# Patient Record
Sex: Female | Born: 1965 | Race: White | Hispanic: No | Marital: Married | Smoking: Never smoker
Health system: Southern US, Community
[De-identification: ages and names within clinical notes are randomized; demographics above are authoritative.]

## PROBLEM LIST (undated history)

## (undated) DIAGNOSIS — F32A Depression, unspecified: Secondary | ICD-10-CM

## (undated) DIAGNOSIS — E669 Obesity, unspecified: Secondary | ICD-10-CM

## (undated) DIAGNOSIS — R232 Flushing: Secondary | ICD-10-CM

## (undated) DIAGNOSIS — I10 Essential (primary) hypertension: Secondary | ICD-10-CM

## (undated) DIAGNOSIS — F329 Major depressive disorder, single episode, unspecified: Secondary | ICD-10-CM

## (undated) DIAGNOSIS — C50912 Malignant neoplasm of unspecified site of left female breast: Secondary | ICD-10-CM

## (undated) DIAGNOSIS — K219 Gastro-esophageal reflux disease without esophagitis: Secondary | ICD-10-CM

## (undated) DIAGNOSIS — Z923 Personal history of irradiation: Secondary | ICD-10-CM

## (undated) HISTORY — DX: Essential (primary) hypertension: I10

## (undated) HISTORY — DX: Depression, unspecified: F32.A

## (undated) HISTORY — PX: ABCESS DRAINAGE: SHX399

## (undated) HISTORY — PX: ABDOMINAL HYSTERECTOMY: SHX81

## (undated) HISTORY — DX: Flushing: R23.2

## (undated) HISTORY — DX: Major depressive disorder, single episode, unspecified: F32.9

## (undated) HISTORY — DX: Personal history of irradiation: Z92.3

## (undated) HISTORY — PX: ESOPHAGOGASTRODUODENOSCOPY: SHX1529

## (undated) HISTORY — DX: Obesity, unspecified: E66.9

## (undated) HISTORY — DX: Gastro-esophageal reflux disease without esophagitis: K21.9

## (undated) NOTE — *Deleted (*Deleted)
   Patient Care Team: Marylen Ponto, MD as PCP - General (Family Medicine) Axel Filler, Larna Daughters, NP as Nurse Practitioner (Hematology and Oncology) Serena Croissant, MD as Consulting Physician (Hematology and Oncology) Antony Blackbird, MD as Consulting Physician (Radiation Oncology) Alesia Morin., MD as Referring Physician (Surgery)  DIAGNOSIS: No diagnosis found.  SUMMARY OF ONCOLOGIC HISTORY: Oncology History  Malignant neoplasm of upper-inner quadrant of left breast in female, estrogen receptor positive (HCC)  01/25/2017 Surgery   Left lumpectomy: IDC grade 1 and DCIS, 9 mmsize, margins negative, 0/2 lymph nodes negative, ER 95%, PR 100 %, HER-2 negative, Ki-67 10%T1b N0 stage IA   03/08/2017 - 04/20/2017 Radiation Therapy   Adjuvant radiation   05/2017 -  Anti-estrogen oral therapy   Tamoxifen daily     CHIEF COMPLIANT: Follow-up of left breast cancer  INTERVAL HISTORY: Tasha Thomas is a 83 y.o. with above-mentioned history of left breast cancer treated with lumpectomy,radiation, and whois currently on tamoxifen.She presents to the clinic today for follow-up.   ALLERGIES:  is allergic to erythromycin.  MEDICATIONS:  Current Outpatient Medications  Medication Sig Dispense Refill  . amLODipine (NORVASC) 5 MG tablet Take 5 mg by mouth daily.    . Biotin 5000 MCG CAPS Take by mouth daily.    . Calcium-Phosphorus-Vitamin D (CALCIUM GUMMIES PO) Take 1,000 mg by mouth daily.    . cetirizine (ZYRTEC) 10 MG tablet Take 10 mg by mouth.    . famotidine (PEPCID) 20 MG tablet Take 1 tablet (20 mg total) by mouth at bedtime as needed for heartburn or indigestion. 30 tablet 3  . hydrochlorothiazide (HYDRODIURIL) 25 MG tablet Take 1 tablet (25 mg total) by mouth daily.    . Multiple Vitamin (MULTIVITAMIN) tablet Take 1 tablet by mouth daily.    . pantoprazole (PROTONIX) 40 MG tablet Take 1 tablet (40 mg total) by mouth daily. 90 tablet 4  . tamoxifen (NOLVADEX) 20 MG tablet  Take 1 tablet (20 mg total) by mouth daily. 90 tablet 3   No current facility-administered medications for this visit.    PHYSICAL EXAMINATION: ECOG PERFORMANCE STATUS: {CHL ONC ECOG PS:[phone]}  There were no vitals filed for this visit. There were no vitals filed for this visit.  BREAST:*** No palpable masses or nodules in either right or left breasts. No palpable axillary supraclavicular or infraclavicular adenopathy no breast tenderness or nipple discharge. (exam performed in the presence of a chaperone)  LABORATORY DATA:  I have reviewed the data as listed No flowsheet data found.  No results found for: WBC, HGB, HCT, MCV, PLT, NEUTROABS  ASSESSMENT & PLAN:  No problem-specific Assessment & Plan notes found for this encounter.    No orders of the defined types were placed in this encounter.  The patient has a good understanding of the overall plan. she agrees with it. she will call with any problems that may develop before the next visit here.  Total time spent: *** mins including face to face time and time spent for planning, charting and coordination of care  Serena Croissant, MD 02/26/2020  I, Kirt Boys Dorshimer, am acting as scribe for Dr. Serena Croissant.  {insert scribe attestation}

---

## 2013-04-17 ENCOUNTER — Encounter: Payer: Self-pay | Admitting: Internal Medicine

## 2013-04-18 ENCOUNTER — Encounter: Payer: Self-pay | Admitting: Internal Medicine

## 2013-04-18 ENCOUNTER — Telehealth: Payer: Self-pay | Admitting: Internal Medicine

## 2013-04-18 ENCOUNTER — Ambulatory Visit (INDEPENDENT_AMBULATORY_CARE_PROVIDER_SITE_OTHER): Payer: Managed Care, Other (non HMO) | Admitting: Internal Medicine

## 2013-04-18 ENCOUNTER — Encounter (INDEPENDENT_AMBULATORY_CARE_PROVIDER_SITE_OTHER): Payer: Self-pay

## 2013-04-18 VITALS — BP 142/98 | HR 98 | Ht 64.0 in | Wt 222.0 lb

## 2013-04-18 DIAGNOSIS — R05 Cough: Secondary | ICD-10-CM

## 2013-04-18 DIAGNOSIS — R059 Cough, unspecified: Secondary | ICD-10-CM

## 2013-04-18 MED ORDER — GABAPENTIN 300 MG PO CAPS: ORAL_CAPSULE | ORAL | Status: DC

## 2013-04-18 NOTE — Patient Instructions (Signed)
Cough is from sinus drainage, acid reflux, possible asthma, and lack of voice rest All of this is working together to cause cyclical cough/LPR cough  #Sinus drainage  -continue  nasal steroid  2 squirts each nostril daily as advised  - start hypertonic nasal saline spray 2 squirts each nostril daily  #Possible Acid Reflux  -START otc zegerid 20mg   1 capsule daily on empty stomach (nurse will send script)    avoid colas, spices, cheeses, spirits, red meats, beer, chocolates, fried foods etc.,   - sleep with head end of bed elevated  - eat small frequent meals  - do not go to bed for 3 hours after last meal  #Possible Asthma or lung disease - Do CT chest  High Resolution CT chest without contrast on ILD protocol. Only  Dr Leanna Battles or Dr. Trudie Reed to read - do methacholine challenge test (you cannot be pregnant or have heart disease for this test)  #Cyclical cough or Irritable larynx  - please choose 2-3 days and observe complete voice rest - no talking or whispering  - at all times there  there is urge to cough, drink water or swallow or sip on throat lozenge - we will refer you to neuro rehab  Mr Tasha Thomas - STARTgabapentin 300mg  once daily x 3 days, then 300mg  twice daily x 3 days, then 300mg  three times daily to continue. If this makes you too sleepy or drowsy call us and we will cut your medication dosing down    #Followup - have CT chest and methacholine test next few to several days - REturn to see me or my NP Tammy immediately after tests to discuss results - any problems call or come sooner

## 2013-04-18 NOTE — Progress Notes (Signed)
Subjective:    Patient ID: Tasha Thomas, female    DOB: [birth year], 47 y.o.   MRN: [id number] PCP Marylen Ponto, MD  HPI  IOV 04/18/2013  Referred for chronic cough. Insidious but abrupt onset of chronic cough after cutting some weeds the weekend before [facility] Day. Quality of cough is dry. Stable in course except for 1 week which seemed to improve partially after starting acid reflux treatment within the cough relapsed despite acid reflux treatment. Overall no change in course. Cough is present in date and night. It has a laryngeal quality in this associated feeling of something stuck in the throat. In addition despite Flonase she is constantly sniffing at her nose. Talking makes her cough worse. Cough is improved by resting.  Blood pressure history - She's not on ACE inhibitors but instead is on calcium channel  Blocker  Sinus history and allergy history -Summer and 4 2014 she's been seen at the [facility] [place] by Dr. Laurette Schimke. His clinical suspicion was laryngopharyngeal reflux and allergic rhinoconjunctivitis. Despite his interventions cough is not improved  Pulmonary history - Spirometry 12/06/2012 a Dr. Kathyrn Lass office was normal. She's never tried any oral inhalers  Acid reflux history -  Remote history of endoscopy there was normal. Currently took acid reflux proton pump for several months and did not help cough. Denies any current active heartburn      Dr Gretta Cool Reflux Symptom Index (> 13-15 suggestive of LPR cough) 0 -> 5  =  none ->severe problem 04/18/2013   Hoarseness of problem with voice 0  Clearing  Of Throat 0  Excess throat mucus or feeling of post nasal drip 0  Difficulty swallowing food, liquid or tablets 0  Cough after eating or lying down 5  Breathing difficulties or choking episodes 3  Troublesome or annoying cough 5  Sensation of something sticking in throat or lump in throat 5  Heartburn, chest pain, indigestion,  or stomach acid coming up 0  TOTAL 18      Kouffman Reflux v Neurogenic Cough Differentiator Reflux  Do you awaken from a sound sleep coughing violently?                            With trouble breathing? Yes  Do you have choking episodes when you cannot  Get enough air, gasping for air ?              no  Do you usually cough when you lie down into  The bed, or when you just lie down to rest ?                          Yes  Do you usually cough after meals or eating?         Yes  Do you cough when (or after) you bend over?    no  GERD SCORE  3  Kouffman Reflux v Neurogenic Cough Differentiator Neurogenic  Do you more-or-less cough all day long? y  Does change of temperature make you cough? y  Does laughing or chuckling cause you to cough? y  Do fumes (perfume, automobile fumes, burned  Toast, etc.,) cause you to cough ?      y  Does speaking, singing, or talking on the phone cause you to cough   ?  y  Neurogenic/Airway score 5     Past Medical History  Diagnosis Date  . Depression   . Hypertension      Family History  Problem Relation Age of Onset  . Lupus Maternal Aunt      History   Social History  . Marital Status: Married    Spouse Name: N/A    Number of Children: N/A  . Years of Education: N/A   Occupational History  . Not on file.   Social History Main Topics  . Smoking status: Never Smoker   . Smokeless tobacco: Never Used  . Alcohol Use: No  . Drug Use: No  . Sexual Activity: Not on file   Other Topics Concern  . Not on file   Social History Narrative  . No narrative on file     Allergies  Allergen Reactions  . Erythromycin Rash     Outpatient Prescriptions Prior to Visit  Medication Sig Dispense Refill  . amLODipine (NORVASC) 5 MG tablet Take 5 mg by mouth daily.      . furosemide (LASIX) 20 MG tablet Take 20 mg by mouth daily.      Marland Kitchen omeprazole (PRILOSEC) 40 MG capsule Take 40 mg by mouth daily.      . ranitidine (ZANTAC)  150 MG capsule Take 150 mg by mouth 2 (two) times daily.       No facility-administered medications prior to visit.        Review of Systems  Constitutional: Negative for fever and unexpected weight change.  HENT: Negative for congestion, dental problem, ear pain, nosebleeds, postnasal drip, rhinorrhea, sinus pressure, sneezing, sore throat and trouble swallowing.   Eyes: Negative for redness and itching.  Respiratory: Positive for cough. Negative for chest tightness, shortness of breath and wheezing.   Cardiovascular: Negative for palpitations and leg swelling.  Gastrointestinal: Negative for nausea and vomiting.  Genitourinary: Negative for dysuria.  Musculoskeletal: Negative for joint swelling.  Skin: Negative for rash.  Neurological: Negative for headaches.  Hematological: Does not bruise/bleed easily.  Psychiatric/Behavioral: Negative for dysphoric mood. The patient is not nervous/anxious.        Objective:   Physical Exam  Vitals reviewed. Constitutional: She is oriented to person, place, and time. She appears well-developed and well-nourished. No distress.  Body mass index is 38.09 kg/(m^2).   HENT:  Head: Normocephalic and atraumatic.  Right Ear: External ear normal.  Left Ear: External ear normal.  Mouth/Throat: Oropharynx is clear and moist. No oropharyngeal exudate.  Eyes: Conjunctivae and EOM are normal. Pupils are equal, round, and reactive to light. Right eye exhibits no discharge. Left eye exhibits no discharge. No scleral icterus.  Neck: Normal range of motion. Neck supple. No JVD present. No tracheal deviation present. No thyromegaly present.  Cardiovascular: Normal rate, regular rhythm, normal heart sounds and intact distal pulses.  Exam reveals no gallop and no friction rub.   No murmur heard. Pulmonary/Chest: Effort normal and breath sounds normal. No respiratory distress. She has no wheezes. She has no rales. She exhibits no tenderness.  Laryngeal cough   Abdominal: Soft. Bowel sounds are normal. She exhibits no distension and no mass. There is no tenderness. There is no rebound and no guarding.  Musculoskeletal: Normal range of motion. She exhibits no edema and no tenderness.  Lymphadenopathy:    She has no cervical adenopathy.  Neurological: She is alert and oriented to person, place, and time. She has normal reflexes. No cranial nerve deficit. She exhibits normal  muscle tone. Coordination normal.  Skin: Skin is warm and dry. No rash noted. She is not diaphoretic. No erythema. No pallor.  Psychiatric: She has a normal mood and affect. Her behavior is normal. Judgment and thought content normal.          Assessment & Plan:

## 2013-04-19 NOTE — Telephone Encounter (Signed)
Called [place] @ [facility], she wanted to let Dr Marchelle Gearing know, Dr Olevia Bowens and Carmelina Noun are on vacation this week, and will be back on Monday, so as per Dr Rama request for them to read CT that is sched for Friday, they will not be able to read until Monday.If he wants some one else to read let them know.

## 2013-04-20 ENCOUNTER — Ambulatory Visit (HOSPITAL_COMMUNITY)
Admission: RE | Admit: 2013-04-20 | Discharge: 2013-04-20 | Disposition: A | Discharge status: Home / Self Care | Payer: Managed Care, Other (non HMO) | Source: Ambulatory Visit | Attending: Internal Medicine | Admitting: Internal Medicine

## 2013-04-20 ENCOUNTER — Ambulatory Visit: Payer: Managed Care, Other (non HMO) | Attending: Internal Medicine

## 2013-04-20 ENCOUNTER — Other Ambulatory Visit: Payer: Managed Care, Other (non HMO)

## 2013-04-20 DIAGNOSIS — R059 Cough, unspecified: Secondary | ICD-10-CM | POA: Insufficient documentation

## 2013-04-20 DIAGNOSIS — R05 Cough: Secondary | ICD-10-CM | POA: Insufficient documentation

## 2013-04-20 DIAGNOSIS — IMO0001 Reserved for inherently not codable concepts without codable children: Secondary | ICD-10-CM | POA: Insufficient documentation

## 2013-04-20 DIAGNOSIS — R498 Other voice and resonance disorders: Secondary | ICD-10-CM | POA: Insufficient documentation

## 2013-04-20 LAB — PULMONARY FUNCTION TEST
FEF 25-75 Post: 2.25 L/sec
FEF 25-75 Pre: 2.43 L/sec
FEF2575-%Change-Post: -7 %
FEF2575-%Pred-Post: 76 %
FEF2575-%Pred-Pre: 82 %
FEV1-%Pred-Pre: 100 %
FEV1-Post: 2.89 L
FEV1-Pre: 2.94 L
FEV1FVC-%Change-Post: 5 %
FEV1FVC-%Pred-Pre: 94 %
FEV6-%Change-Post: -6 %
FEV6-%Pred-Post: 100 %
FEV6-Pre: 3.82 L
FEV6FVC-%Change-Post: 0 %
FEV6FVC-%Pred-Pre: 101 %
FVC-%Pred-Post: 98 %
FVC-%Pred-Pre: 105 %
FVC-Post: 3.58 L
Post FEV1/FVC ratio: 81 %
Post FEV6/FVC ratio: 100 %
Pre FEV1/FVC ratio: 77 %

## 2013-04-20 MED ORDER — METHACHOLINE 16 MG/ML NEB SOLN
2.0000 mL | Freq: Once | RESPIRATORY_TRACT | Status: AC
  Administered 2013-04-20: 32 mg via RESPIRATORY_TRACT

## 2013-04-20 MED ORDER — METHACHOLINE 0.0625 MG/ML NEB SOLN
2.0000 mL | Freq: Once | RESPIRATORY_TRACT | Status: AC
  Administered 2013-04-20: 0.125 mg via RESPIRATORY_TRACT

## 2013-04-20 MED ORDER — ALBUTEROL SULFATE (5 MG/ML) 0.5% IN NEBU
2.5000 mg | INHALATION_SOLUTION | Freq: Once | RESPIRATORY_TRACT | Status: AC
  Administered 2013-04-20: 2.5 mg via RESPIRATORY_TRACT

## 2013-04-20 MED ORDER — METHACHOLINE 4 MG/ML NEB SOLN
2.0000 mL | Freq: Once | RESPIRATORY_TRACT | Status: AC
  Administered 2013-04-20: 8 mg via RESPIRATORY_TRACT

## 2013-04-20 MED ORDER — METHACHOLINE 1 MG/ML NEB SOLN
2.0000 mL | Freq: Once | RESPIRATORY_TRACT | Status: AC
  Administered 2013-04-20: 2 mg via RESPIRATORY_TRACT

## 2013-04-20 MED ORDER — METHACHOLINE 0.25 MG/ML NEB SOLN
2.0000 mL | Freq: Once | RESPIRATORY_TRACT | Status: AC
  Administered 2013-04-20: 0.5 mg via RESPIRATORY_TRACT

## 2013-04-20 MED ORDER — SODIUM CHLORIDE 0.9 % IN NEBU
3.0000 mL | INHALATION_SOLUTION | Freq: Once | RESPIRATORY_TRACT | Status: AC
  Administered 2013-04-20: 3 mL via RESPIRATORY_TRACT

## 2013-04-23 ENCOUNTER — Telehealth: Payer: Self-pay | Admitting: Internal Medicine

## 2013-04-23 ENCOUNTER — Ambulatory Visit: Payer: Managed Care, Other (non HMO)

## 2013-04-23 NOTE — Telephone Encounter (Signed)
I told the pt that she needs to wait until after CT to have OV per last OV note. We are waiting on MR to do a peer to peer so we can schedule follow-up. MR please advise if you have done peer to peer? Carron Curie, CMA

## 2013-04-24 ENCOUNTER — Ambulatory Visit: Payer: Managed Care, Other (non HMO) | Admitting: Adult Health

## 2013-04-24 NOTE — Telephone Encounter (Signed)
The visit with TP 04/24/2013 is to discuss results. Cancel TP visit 04/24/2013  I have not had time to do peer to peer. Hppefully sometome this week  Dr. Kalman Shan, M.D., [facility].C.P Pulmonary and Critical Care Medicine Staff Physician [facility] Pager: [phone], If no answer or between  15:00h - 7:00h: call [phone]  04/24/2013 5:16 AM

## 2013-04-24 NOTE — Telephone Encounter (Signed)
I already cancelled the appt for today. Once you do the peer to peer please let me know so I can schedule the CT and an appt. Carron Curie, CMA

## 2013-04-27 ENCOUNTER — Encounter: Payer: Self-pay | Admitting: Adult Health

## 2013-04-28 ENCOUNTER — Encounter: Payer: Self-pay | Admitting: Internal Medicine

## 2013-04-28 DIAGNOSIS — R05 Cough: Secondary | ICD-10-CM | POA: Insufficient documentation

## 2013-04-28 NOTE — Assessment & Plan Note (Signed)
#  COPD - copd appears stable - continue copd medications; samples if needed - encourage you changing to maintenance phase rehab  #Followup Return to see me in 3 months or sooner if needed  

## 2013-04-30 NOTE — Telephone Encounter (Signed)
Tammy and Candise Bowens  The official instructions on her might not be correct. She is to have CT chest (just approved for CT) and she hsa finished methacholine and will follow with Tammyf ro coug   Dr. Kalman Shan, M.D., [facility].C.P Pulmonary and Critical Care Medicine Staff Physician [facility] Pulmonary and Critical Care Pager: [phone], If no answer or between  15:00h - 7:00h: call [phone]  04/30/2013 9:16 AM

## 2013-04-30 NOTE — Telephone Encounter (Signed)
Ok  Looks like CT was ordered

## 2013-05-01 ENCOUNTER — Ambulatory Visit: Payer: Managed Care, Other (non HMO)

## 2013-05-01 NOTE — Telephone Encounter (Signed)
CT has been scheduled for 05-04-13, rov with TP set for 05-14-12 at 4:15pm, this is first available after holiday. Carron Curie, CMA

## 2013-05-04 ENCOUNTER — Ambulatory Visit
Admission: RE | Admit: 2013-05-04 | Discharge: 2013-05-04 | Disposition: A | Discharge status: Home / Self Care | Payer: Managed Care, Other (non HMO) | Source: Ambulatory Visit | Attending: Internal Medicine | Admitting: Internal Medicine

## 2013-05-04 DIAGNOSIS — R05 Cough: Secondary | ICD-10-CM

## 2013-05-07 ENCOUNTER — Ambulatory Visit: Payer: Managed Care, Other (non HMO)

## 2013-05-14 ENCOUNTER — Encounter: Payer: Self-pay | Admitting: Adult Health

## 2013-05-14 ENCOUNTER — Ambulatory Visit (INDEPENDENT_AMBULATORY_CARE_PROVIDER_SITE_OTHER): Payer: Managed Care, Other (non HMO) | Admitting: Adult Health

## 2013-05-14 VITALS — BP 140/84 | HR 96 | Temp 98.0°F | Ht 64.0 in | Wt 221.8 lb

## 2013-05-14 DIAGNOSIS — R059 Cough, unspecified: Secondary | ICD-10-CM

## 2013-05-14 DIAGNOSIS — R05 Cough: Secondary | ICD-10-CM

## 2013-05-14 NOTE — Assessment & Plan Note (Signed)
Cyclical cough  With AR triggers  Some improvement with neurontin  Cont w/ aggressive trigger control  MCT neg. PFT nml. CT chest with no ILD    Plan  Continue on Gabapentin 300mg  2-3 times daily as tolerated.  Add Delsym 2 tsp Twice daily  For cough .  Add Allegra 180mg  daily  Add Chortrimeton 4mg  2 tabs At bedtime   No mints or gum .  GOAL IS NO COUGHING OR CLEARING THROAT.  Use sips water, sugarless candy to help with cough.  follow up Dr. Chase Caller in 6 weeks and As needed   Please contact office for sooner follow up if symptoms do not improve or worsen or seek emergency care

## 2013-05-14 NOTE — Progress Notes (Signed)
Subjective:    Patient ID: Tasha Thomas, female    DOB: 1966/03/17, 48 y.o.   MRN: [id number] PCP Ronita Hipps, MD  HPI IOV 04/18/2013 Referred for chronic cough. Insidious but abrupt onset of chronic cough after cutting some weeds the weekend before [facility] Day. Quality of cough is dry. Stable in course except for 1 week which seemed to improve partially after starting acid reflux treatment within the cough relapsed despite acid reflux treatment. Overall no change in course. Cough is present in date and night. It has a laryngeal quality in this associated feeling of something stuck in the throat. In addition despite Flonase she is constantly sniffing at her nose. Talking makes her cough worse. Cough is improved by resting.  Blood pressure history - She's not on ACE inhibitors but instead is on calcium channel  Blocker  Sinus history and allergy history -Summer and 4 2014 she's been seen at the [facility] [place] by Dr. Allena Katz. His clinical suspicion was laryngopharyngeal reflux and allergic rhinoconjunctivitis. Despite his interventions cough is not improved  Pulmonary history - Spirometry 12/06/2012 a Dr. Bruna Potter office was normal. She's never tried any oral inhalers  Acid reflux history -  Remote history of endoscopy there was normal. Currently took acid reflux proton pump for several months and did not help cough. Denies any current active heartburn      Dr Lorenza Cambridge Reflux Symptom Index (> 13-15 suggestive of LPR cough) 0 -> 5  =  none ->severe problem 04/18/2013   Hoarseness of problem with voice 0  Clearing  Of Throat 0  Excess throat mucus or feeling of post nasal drip 0  Difficulty swallowing food, liquid or tablets 0  Cough after eating or lying down 5  Breathing difficulties or choking episodes 3  Troublesome or annoying cough 5  Sensation of something sticking in throat or lump in throat 5  Heartburn, chest pain, indigestion, or  stomach acid coming up 0  TOTAL 18      Kouffman Reflux v Neurogenic Cough Differentiator Reflux  Do you awaken from a sound sleep coughing violently?                            With trouble breathing? Yes  Do you have choking episodes when you cannot  Get enough air, gasping for air ?              no  Do you usually cough when you lie down into  The bed, or when you just lie down to rest ?                          Yes  Do you usually cough after meals or eating?         Yes  Do you cough when (or after) you bend over?    no  GERD SCORE  3  Kouffman Reflux v Neurogenic Cough Differentiator Neurogenic  Do you more-or-less cough all day long? y  Does change of temperature make you cough? y  Does laughing or chuckling cause you to cough? y  Do fumes (perfume, automobile fumes, burned  Toast, etc.,) cause you to cough ?      y  Does speaking, singing, or talking on the phone cause you to cough   ?  y  Neurogenic/Airway score 5    05/14/2013 Follow up and Test review  Returns for follow up .  Seen  1 month ago for cough consult, tx w/ cyclical cough regimen , along neurontin.  Underwent CT chest 12/26 with No evidence of ILD , mild RUL ground glass c/w inflammatory appearance.  Underwent Methacholine Challenge Test on 12/12 /14  With no airflow obstruction , no BD response,  Normal MCT  Went to Neuro speech therapy, finished program.  Taking Zegerid daily .  Currently on gabapentin 300mg  Twice daily  .  Some better with less cough at night.  Still has cough , dripping nose and throat clearing.  No hemoptysis, chest pain, orthopnea, or fever.     Review of Systems  Constitutional: Negative for fever and unexpected weight change.  HENT: Negative for congestion, dental problem, ear pain, nosebleeds, postnasal drip, rhinorrhea, sinus pressure, sneezing, sore throat and trouble swallowing.   Eyes: Negative for redness and itching.  Respiratory: Positive for cough.  Negative for chest tightness, shortness of breath and wheezing.   Cardiovascular: Negative for palpitations and leg swelling.  Gastrointestinal: Negative for nausea and vomiting.  Genitourinary: Negative for dysuria.  Musculoskeletal: Negative for joint swelling.  Skin: Negative for rash.  Neurological: Negative for headaches.  Hematological: Does not bruise/bleed easily.  Psychiatric/Behavioral: Negative for dysphoric mood. The patient is not nervous/anxious.        Objective:   Physical Exam  Vitals reviewed. GEN: A/Ox3; pleasant , NAD, well nourished   HEENT:  /AT,  EACs-clear, TMs-wnl, NOSE-clear, THROAT-clear, no lesions, no postnasal drip or exudate noted.   NECK:  Supple w/ fair ROM; no JVD; normal carotid impulses w/o bruits; no thyromegaly or nodules palpated; no lymphadenopathy.  RESP  Clear  P & A; w/o, wheezes/ rales/ or rhonchi.no accessory muscle use, no dullness to percussion  CARD:  RRR, no m/r/g  , no peripheral edema, pulses intact, no cyanosis or clubbing.  GI:   Soft & nt; nml bowel sounds; no organomegaly or masses detected.  Musco: Warm bil, no deformities or joint swelling noted.   Neuro: alert, no focal deficits noted.    Skin: Warm, no lesions or rashes      Assessment & Plan:

## 2013-05-14 NOTE — Patient Instructions (Signed)
Continue on Gabapentin 300mg  2-3 times daily as tolerated.  Add Delsym 2 tsp Twice daily  For cough .  Add Allegra 180mg  daily  Add Chortrimeton 4mg  2 tabs At bedtime   No mints or gum .  GOAL IS NO COUGHING OR CLEARING THROAT.  Use sips water, sugarless candy to help with cough.  follow up Dr. Chase Caller in 6 weeks and As needed   Please contact office for sooner follow up if symptoms do not improve or worsen or seek emergency care

## 2013-05-15 ENCOUNTER — Ambulatory Visit: Payer: Managed Care, Other (non HMO) | Admitting: Speech Pathology

## 2013-05-17 ENCOUNTER — Encounter: Payer: Managed Care, Other (non HMO) | Admitting: Speech Pathology

## 2013-06-13 ENCOUNTER — Telehealth: Payer: Self-pay | Admitting: Adult Health

## 2013-06-13 MED ORDER — GABAPENTIN 300 MG PO CAPS: 300.0000 mg | ORAL_CAPSULE | Freq: Two times a day (BID) | ORAL | Status: DC

## 2013-06-13 NOTE — Telephone Encounter (Signed)
Spoke with pt and she is needing a refill on gabapentin. Pt is taking it twice a day. Refill sent. [place] Bing, CMA

## 2013-06-13 NOTE — Telephone Encounter (Signed)
lmomtcb x1 for pt 

## 2013-06-27 ENCOUNTER — Encounter: Payer: Self-pay | Admitting: Internal Medicine

## 2013-06-27 ENCOUNTER — Ambulatory Visit (INDEPENDENT_AMBULATORY_CARE_PROVIDER_SITE_OTHER): Payer: Managed Care, Other (non HMO) | Admitting: Internal Medicine

## 2013-06-27 VITALS — BP 122/84 | HR 95 | Ht 64.0 in | Wt 222.2 lb

## 2013-06-27 DIAGNOSIS — R05 Cough: Secondary | ICD-10-CM

## 2013-06-27 DIAGNOSIS — R059 Cough, unspecified: Secondary | ICD-10-CM

## 2013-06-27 DIAGNOSIS — R053 Chronic cough: Secondary | ICD-10-CM

## 2013-06-27 NOTE — Progress Notes (Signed)
Subjective:    Patient ID: Tasha Thomas, female    DOB: April 14, 1966, 48 y.o.   MRN: [id number]  HPI  PCP HOLT,LYNLEY S, MD  HPI IOV 04/18/2013 Referred for chronic cough. Insidious but abrupt onset of chronic cough after cutting some weeds the weekend before [facility]. Quality of cough is dry. Stable in course except for 1 week which seemed to improve partially after starting acid reflux treatment within the cough relapsed despite acid reflux treatment. Overall no change in course. Cough is present in date and night. It has a laryngeal quality in this associated feeling of something stuck in the throat. In addition despite Flonase she is constantly sniffing at her nose. Talking makes her cough worse. Cough is improved by resting.  Blood pressure history - She's not on ACE inhibitors but instead is on calcium channel  Blocker  Sinus history and allergy history -Summer and 4 2014 she's been seen [facility] [place] by Dr. Allena Katz. His clinical suspicion was laryngopharyngeal reflux and allergic rhinoconjunctivitis. Despite his interventions cough is not improved  Pulmonary history - Spirometry 12/06/2012 a Dr. Bruna Potter office was normal. She's never tried any oral inhalers  Acid reflux history -  Remote history of endoscopy there was normal. Currently took acid reflux proton pump for several months and did not help cough. Denies any current active heartburn  REC Cough is from sinus drainage, acid reflux, possible asthma, and lack of voice rest All of this is working together to cause cyclical cough/LPR cough  #Sinus drainage  -continue  nasal steroid  2 squirts each nostril daily as advised  - start hypertonic nasal saline spray 2 squirts each nostril daily  #Possible Acid Reflux  -START otc zegerid 20mg   1 capsule daily on empty stomach (nurse will send script)    avoid colas, spices, cheeses, spirits, red meats, beer, chocolates, fried  foods etc.,   - sleep with head end of bed elevated  - eat small frequent meals  - do not go to bed for 3 hours after last meal  #Possible Asthma or lung disease - Do CT chest  High Resolution CT chest without contrast on ILD protocol. Only  Dr Lorin Picket or Dr. Vinnie Langton to read - do methacholine challenge test (you cannot be pregnant or have heart disease for this test)  #Cyclical cough or Irritable larynx  - please choose 2-3 days and observe complete voice rest - no talking or whispering  - at all times there  there is urge to cough, drink water or swallow or sip on throat lozenge - we will refer you to neuro rehab  [name] - STARTgabapentin 300mg  once daily x 3 days, then 300mg  twice daily x 3 days, then 300mg  three times daily to continue. If this makes you too sleepy or drowsy call us and we will cut your medication dosing down    #Followup - have CT chest and methacholine test next few to several days - REturn to see me or my NP Tammy immediately after tests to discuss results - any problems call or come sooner  05/14/2013 Follow up and Test review  Returns for follow up .  Seen  1 month ago for cough consult, tx w/ cyclical cough regimen , along neurontin.  Underwent CT chest 12/26 with No evidence of ILD , mild RUL ground glass c/w inflammatory appearance.  Underwent Methacholine Challenge Test on 12/12 /14 : With no  airflow obstruction , no BD response,  Normal MCT  Went to Neuro speech therapy, finished program.  Taking Zegerid daily .  Currently on gabapentin 300mg  Twice daily  .  Some better with less cough at night.  Still has cough , dripping nose and throat clearing.  No hemoptysis, chest pain, orthopnea, or fever.    OV 06/27/2013  Chief Complaint  Patient presents with  . Follow-up    Pt c/o nonprod cough, worse with activity.   Followup chronic cough. She said that after starting Neurontin and undergoing speech therapy her cough improved  and resolved. Then she started weaning herself off Neurontin which he weaned off just over a month ago. Then a few weeks ago her cough returned. She then restarted her Neurontin but the cough still persists. The Neurontin this does not help the cough although overall the cough is at a lower level of severity than before.  In terms of sinus: She continues with saline nasal spray nasal steroid and Chlor-Trimeton. She had ENT eval in [place] x 2 per Hx: Normal. Reportedly had sinus  CT too In terms of acid reflux: She continues with Zegerid although not sure about diet compliance In terms of lung disease: Methacholine challenge test rule out asthma. CT scan 12/26/201`4: Rule out interstitial lung disease. She only had mild right upper lobe groundglass infiltrate   Dr Lorenza Cambridge Reflux Symptom Index (> 13-15 suggestive of LPR cough)  04/18/2013  06/27/2013   Hoarseness of problem with voice 0 0  Clearing  Of Throat 0 2  Excess throat mucus or feeling of post nasal drip 0 0  Difficulty swallowing food, liquid or tablets 0 0  Cough after eating or lying down 5 5  Breathing difficulties or choking episodes 3 1  Troublesome or annoying cough 5 5  Sensation of something sticking in throat or lump in throat 5 5  Heartburn, chest pain, indigestion, or stomach acid coming up 0 0  TOTAL 18 18      Kouffman Reflux v Neurogenic Cough Differentiator Reflux REflux  Do you awaken from a sound sleep coughing violently?                            With trouble breathing? Yes no  Do you have choking episodes when you cannot  Get enough air, gasping for air ?              no no  Do you usually cough when you lie down into  The bed, or when you just lie down to rest ?                          Yes yes  Do you usually cough after meals or eating?         Yes yes  Do you cough when (or after) you bend over?    no no  GERD SCORE  3 2  Kouffman Reflux v Neurogenic Cough Differentiator Neurogenic yNeurogenic  Do  you more-or-less cough all day long? y y  Does change of temperature make you cough? y n  Does laughing or chuckling cause you to cough? y n  Do fumes (perfume, automobile fumes, burned  Toast, etc.,) cause you to cough ?      y n  Does speaking, singing, or talking on the phone cause you to cough   ?  y n  Neurogenic/Airway score 5 1        Review of Systems  Constitutional: Negative for fever and unexpected weight change.  HENT: Negative for congestion, dental problem, ear pain, nosebleeds, postnasal drip, rhinorrhea, sinus pressure, sneezing, sore throat and trouble swallowing.   Eyes: Negative for redness and itching.  Respiratory: Positive for cough and shortness of breath. Negative for chest tightness and wheezing.   Cardiovascular: Negative for palpitations and leg swelling.  Gastrointestinal: Negative for nausea and vomiting.  Genitourinary: Negative for dysuria.  Musculoskeletal: Negative for joint swelling.  Skin: Negative for rash.  Neurological: Negative for headaches.  Hematological: Does not bruise/bleed easily.  Psychiatric/Behavioral: Negative for dysphoric mood. The patient is not nervous/anxious.        Objective:   Physical Exam  Physical Exam  Vitals reviewed. GEN: A/Ox3; pleasant , NAD, well nourished   HEENT:  [place]/AT,  EACs-clear, TMs-wnl, NOSE-clear, THROAT-clear, no lesions, no postnasal drip or exudate noted.   NECK:  Supple w/ fair ROM; no JVD; normal carotid impulses w/o bruits; no thyromegaly or nodules palpated; no lymphadenopathy.  RESP  Clear  P & A; w/o, wheezes/ rales/ or rhonchi.no accessory muscle use, no dullness to percussion  CARD:  RRR, no m/r/g  , no peripheral edema, pulses intact, no cyanosis or clubbing.  GI:   Soft & nt; nml bowel sounds; no organomegaly or masses detected.  Musco: Warm bil, no deformities or joint swelling noted.   Neuro: alert, no focal deficits noted.    Skin: Warm, no lesions or  rashes       Assessment & Plan:

## 2013-06-27 NOTE — Patient Instructions (Signed)
Increase gabapentin to 300mg  three times daily Take low glycemic diet sheet; eat only foods in left lane; will help with weight loss Continue other sinus and acid reflux measures against cough REturn in 6 weeks to report progress  - cough score at followup  - 2nd opinion if unimproved at followup

## 2013-07-01 NOTE — Assessment & Plan Note (Addendum)
Cough has recurred due to coming off neurontin  PLAN Increase gabapentin to 300mg  three times daily Take low glycemic diet sheet; eat only foods in left lane; will help with weight loss Continue other sinus and acid reflux measures against cough REturn in 6 weeks to report progress  - cough score at followup  - 2nd opinion if unimproved at followup

## 2013-08-03 ENCOUNTER — Ambulatory Visit: Payer: Managed Care, Other (non HMO) | Admitting: Internal Medicine

## 2013-08-09 ENCOUNTER — Ambulatory Visit: Payer: Managed Care, Other (non HMO) | Admitting: Internal Medicine

## 2013-08-27 ENCOUNTER — Encounter: Payer: Self-pay | Admitting: Internal Medicine

## 2013-09-11 ENCOUNTER — Ambulatory Visit: Payer: Managed Care, Other (non HMO) | Admitting: Internal Medicine

## 2013-09-26 ENCOUNTER — Ambulatory Visit: Payer: Managed Care, Other (non HMO) | Admitting: Internal Medicine

## 2013-10-07 ENCOUNTER — Other Ambulatory Visit: Payer: Self-pay | Admitting: Adult Health

## 2014-06-20 IMAGING — CT CT CHEST HIGH RESOLUTION W/O CM
3 of 6 series · 14 of 30 positions shown, 15 images · non-contrast
Comparison: None.

CLINICAL DATA: Persistent cough. Question interstitial lung
disease.

EXAM:
CHEST CT WITHOUT CONTRAST
TECHNIQUE: Multidetector CT imaging of the chest was performed following the
standard protocol without intravenous contrast. High resolution
imaging of the lungs, as well as inspiratory and expiratory imaging,
was performed.

[Series 5: chest w/o · axial · non-contrast · 0.70mm/px · z∈[-210,-80]mm · 3 of 52 slices shown, 4 images]
[im 13/52  mediastinal]
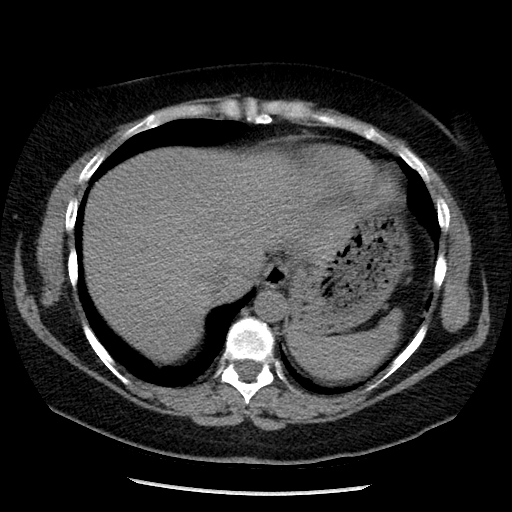
[im 13/52  lung]
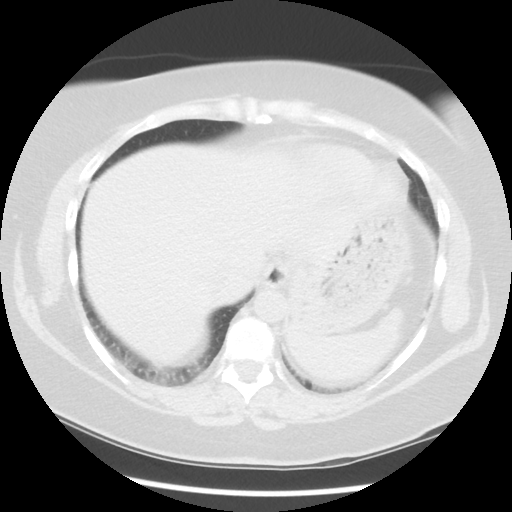
[im 26/52  lung]
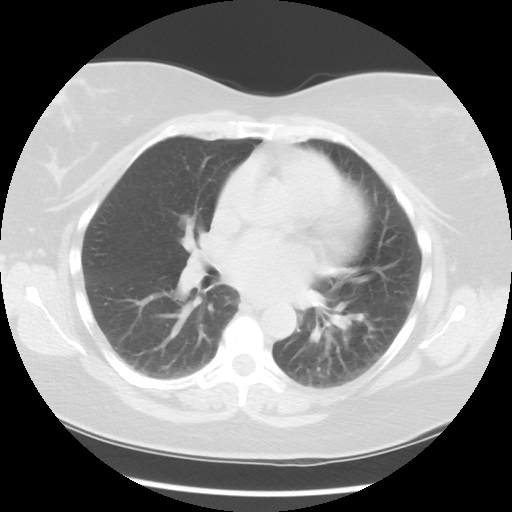
[im 39/52  lung]
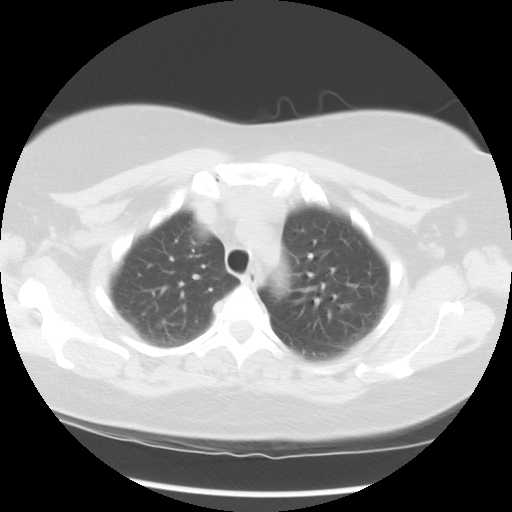

[Series 6: lung windows · axial · 0.70mm/px · z∈[-210,-80]mm · 3 of 52 slices shown]
[im 13/52  lung]
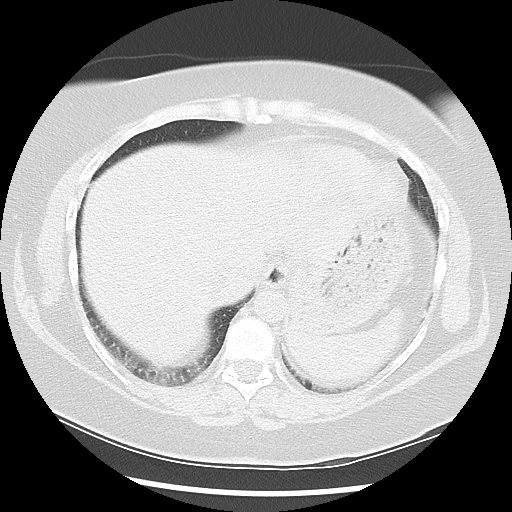
[im 26/52  lung]
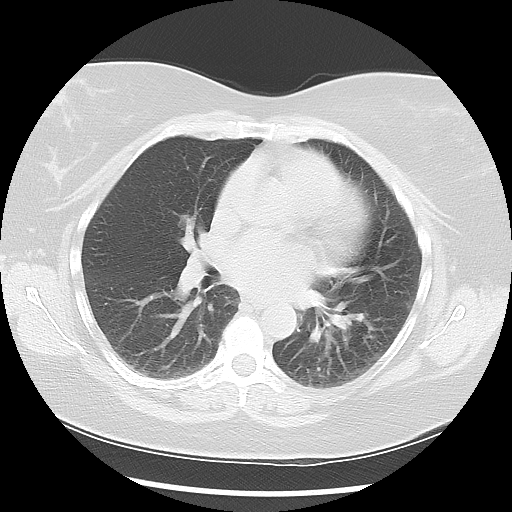
[im 39/52  lung]
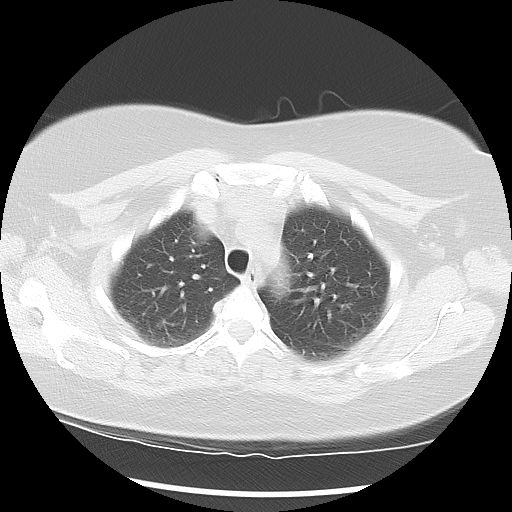

[Series 602: sagittal body · sagittal · 0.70mm/px · 8 of 145 slices shown]
[im 11/145  mediastinal]
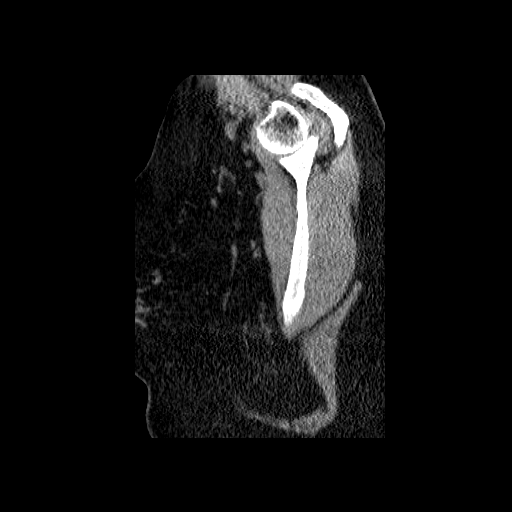
[im 31/145  mediastinal]
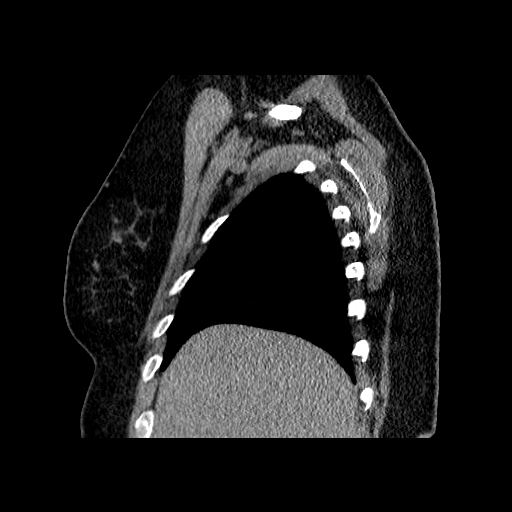
[im 52/145  mediastinal]
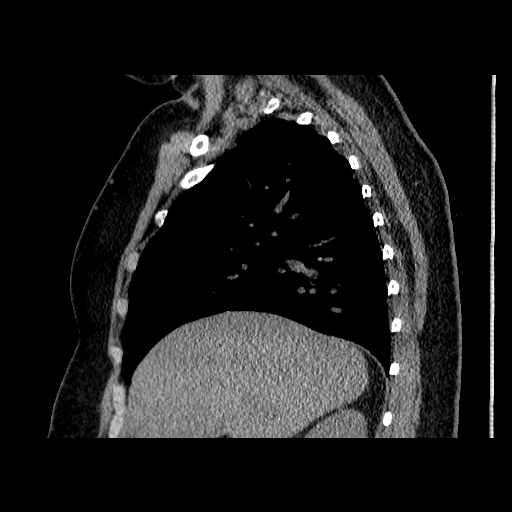
[im 62/145  mediastinal]
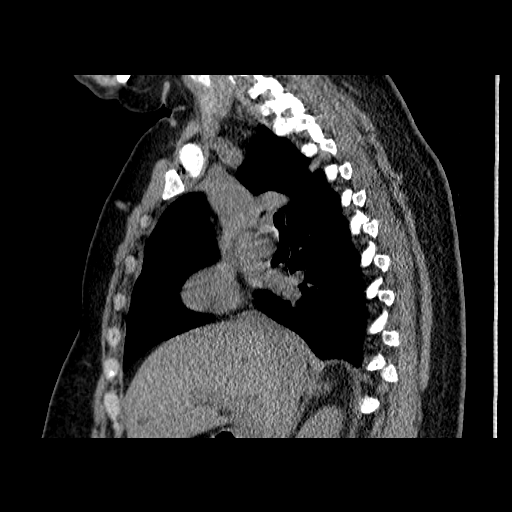
[im 83/145  mediastinal]
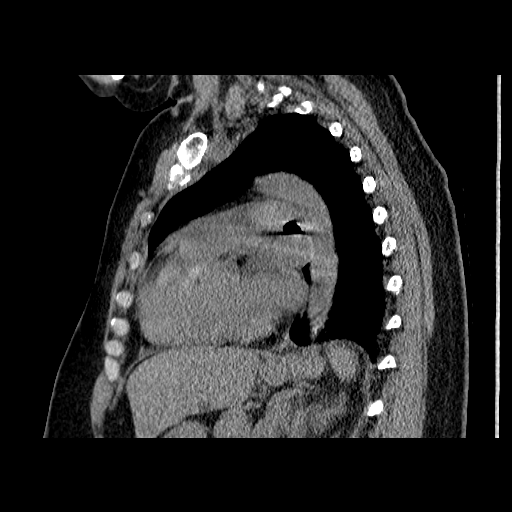
[im 93/145  mediastinal]
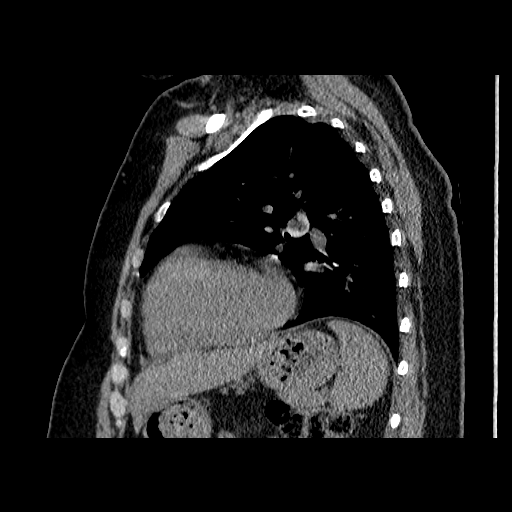
[im 114/145  mediastinal]
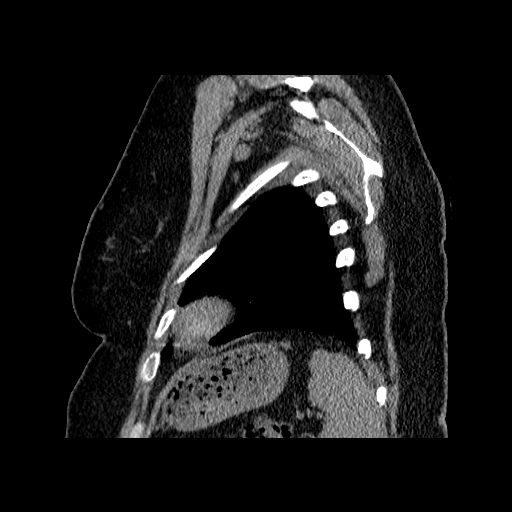
[im 134/145  mediastinal]
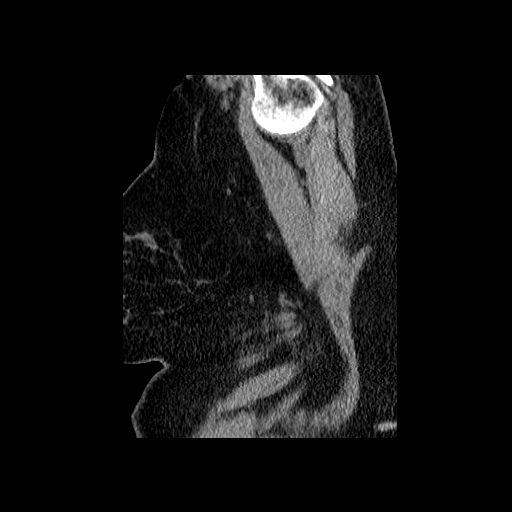

[14 of 30 positions shown; findings below may reference images not displayed]

FINDINGS: No pathologically enlarged mediastinal, hilar or axillary lymph
nodes. Heart is at the upper limits of normal in size. No
pericardial effusion.

Image quality is somewhat degraded by a respiratory motion. There is
a focal area of ill-defined ground-glass in the anterior segment
right upper lobe. Minimal dependent atelectasis in the lower lobes.
Lungs are otherwise clear. No subpleural reticulation, traction
bronchiectasis/ bronchiolectasis, architectural distortion or
honeycombing. No air trapping on inspiratory and expiratory imaging.
No pleural fluid. Airway is unremarkable.

Incidental imaging of the upper abdomen shows no acute findings. No
worrisome lytic or sclerotic lesions.
IMPRESSION: 1. No evidence of interstitial lung disease.
2. Focal area of ill-defined ground-glass in the right upper lobe
appears infectious or inflammatory in etiology.

## 2017-01-04 DIAGNOSIS — Z6835 Body mass index (BMI) 35.0-35.9, adult: Secondary | ICD-10-CM | POA: Insufficient documentation

## 2017-01-25 HISTORY — PX: BREAST LUMPECTOMY: SHX2

## 2017-02-07 DIAGNOSIS — Z09 Encounter for follow-up examination after completed treatment for conditions other than malignant neoplasm: Secondary | ICD-10-CM | POA: Insufficient documentation

## 2017-02-11 ENCOUNTER — Telehealth: Payer: Self-pay | Admitting: Hematology and Oncology

## 2017-02-11 ENCOUNTER — Encounter: Payer: Self-pay | Admitting: *Deleted

## 2017-02-11 ENCOUNTER — Encounter: Payer: Self-pay | Admitting: Radiation Oncology

## 2017-02-11 NOTE — Telephone Encounter (Signed)
Appt has been scheduled for the pt to see Dr. Lindi Adie on 10/8 at 215pm. Pt aware to arrive 30 minutes early. Requested the pt's mammograms from Dr. Venita Sheffield office.

## 2017-02-14 ENCOUNTER — Ambulatory Visit (HOSPITAL_BASED_OUTPATIENT_CLINIC_OR_DEPARTMENT_OTHER): Payer: Self-pay | Admitting: Hematology and Oncology

## 2017-02-14 ENCOUNTER — Encounter: Payer: Self-pay | Admitting: *Deleted

## 2017-02-14 DIAGNOSIS — C50212 Malignant neoplasm of upper-inner quadrant of left female breast: Secondary | ICD-10-CM

## 2017-02-14 DIAGNOSIS — Z170 Estrogen receptor positive status [ER+]: Secondary | ICD-10-CM

## 2017-02-14 NOTE — Assessment & Plan Note (Signed)
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 After reviewing the pathology in detail, we proceeded to discuss the different treatment options between radiation, chemotherapy, antiestrogen therapies.  Patient's cancer has extremely favorable characteristics.. She has low-grade low proliferation rate, strongly ER/PR positivity and the size of 0.9 cm. Because of this I did not recommend doing molecular testing.  Recommendations: 1. Adjuvant radiation therapy followed by 2. Adjuvant antiestrogen therapy with tamoxifen 20 mg daily 10 years versus switching her after she becomes menopausal to anastrozole.  Return to clinic after end of radiation.

## 2017-02-14 NOTE — Progress Notes (Signed)
[facility] CONSULT NOTE  Patient Care Team: Ronita Hipps, MD as PCP - General (Family Medicine)  CHIEF COMPLAINTS/PURPOSE OF CONSULTATION:  Newly diagnosed breast cancer  HISTORY OF PRESENTING ILLNESS:  Tasha Thomas 51 y.o. female is here because of recent diagnosis of left breast cancer. Patient had a routine screening mammogram the detected abnormality in the left breast.he underwent a biopsy which came back as invasive ductal carcinoma with DCIS. It was ER/PR positive and HER-2 negative with a Ki-67 of 10% and it was grade 1. She underwent lumpectomy at [facility] and was found to have a 0.9 cm grade 1 IDC with DCIS. She was referred to [place] for adjuvant treatment options. She reports to have recovered very well from the surgery. She has an appointment to see radiation next week.  I reviewed her records extensively and collaborated the history with the patient.  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 Depression   . Hypertension     SURGICAL HISTORY: Recent left lumpectomy  SOCIAL HISTORY: Social History   Social History  . Marital status: Married    Spouse name: N/A  . Number of children: N/A  . Years of education: N/A   Occupational History  . Not on file.   Social History Main Topics  . Smoking status: Never Smoker  . Smokeless tobacco: Never Used  . Alcohol use No  . Drug use: No  . Sexual activity: Not on file   Other Topics Concern  . Not on file   Social History Narrative  . No narrative on file    FAMILY HISTORY: Family History  Problem Relation Age of Onset  . Lupus Maternal Aunt      ALLERGIES:  is allergic to erythromycin.  MEDICATIONS:  Current Outpatient Prescriptions  Medication Sig Dispense Refill  . amLODipine (NORVASC) 5 MG tablet Take 5 mg by mouth daily.    . fexofenadine (ALLEGRA) 180 MG tablet Take 180 mg by mouth daily.    . furosemide (LASIX) 20 MG tablet Take 20 mg by mouth daily.    Marland Kitchen gabapentin (NEURONTIN) 300 MG capsule Take 1 capsule (300 mg total) by mouth 2 (two) times daily. 60 capsule 3  . mometasone (NASONEX) 50 MCG/ACT nasal spray Place 2 sprays into the nose daily.    Earney Navy Bicarbonate (ZEGERID OTC PO) Take 1 tablet by mouth daily.    Marland Kitchen SALINE NA Place into the nose daily.     No current facility-administered medications for this visit.     REVIEW OF SYSTEMS:   Constitutional: Denies fevers, chills or abnormal night sweats Eyes: Denies blurriness of vision, double vision or watery eyes Ears, nose, mouth, throat, and face: Denies mucositis or sore throat Respiratory: Denies cough, dyspnea or wheezes Cardiovascular: Denies palpitation, chest discomfort or lower extremity swelling Gastrointestinal:  Denies nausea, heartburn or change in bowel habits Skin: Denies abnormal skin rashes Lymphatics: Denies new lymphadenopathy or easy bruising Neurological:Denies numbness, tingling or new weaknesses Behavioral/Psych: Mood is stable, no new changes  Breast: left lumpectomy All other systems were reviewed with the patient and are negative.  PHYSICAL EXAMINATION:  ECOG PERFORMANCE STATUS: 1 - Symptomatic but completely ambulatory  Vitals:   02/14/17 1422  BP: (!) 134/91  Pulse: 60  Resp: 18  Temp: 98.4 F (36.9 C)  SpO2: 99%   Filed Weights   02/14/17 1422  Weight: 216 lb 4.8 oz (98.1 kg)    GENERAL:alert, no distress and comfortable SKIN: skin color, texture, turgor are normal, no rashes or significant lesions EYES: normal, conjunctiva are pink and non-injected, sclera clear OROPHARYNX:no exudate, no erythema and  lips, buccal mucosa, and tongue normal  NECK: supple, thyroid normal size, non-tender, without nodularity LYMPH:  no palpable lymphadenopathy in the cervical, axillary or inguinal LUNGS: clear to auscultation and percussion with normal breathing effort HEART: regular rate & rhythm and no murmurs and no lower extremity edema ABDOMEN:abdomen soft, non-tender and normal bowel sounds Musculoskeletal:no cyanosis of digits and no clubbing  PSYCH: alert & oriented x 3 with fluent speech NEURO: no focal motor/sensory deficits  RADIOGRAPHIC STUDIES: I have personally reviewed the radiological reports and agreed with the findings in the report.  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 After reviewing the pathology in detail, we proceeded to discuss the different treatment options between radiation, chemotherapy, antiestrogen therapies.  Patient's cancer has extremely favorable characteristics.. She has low-grade low proliferation rate, strongly ER/PR positivity and the size of 0.9 cm. Because of this I did not recommend doing molecular testing.  Recommendations: 1. Adjuvant radiation therapy followed by 2. Adjuvant antiestrogen therapy with tamoxifen 20 mg daily 10 years versus switching her after she becomes menopausal to anastrozole.  Return to clinic after end of radiation.    All questions were answered. The patient knows to call the clinic with any problems, questions or concerns.    Rulon Eisenmenger, MD 02/14/17

## 2017-02-17 ENCOUNTER — Encounter: Payer: Self-pay | Admitting: Radiation Oncology

## 2017-02-17 NOTE — Progress Notes (Signed)
Location of Breast Cancer: upper-inner quadrant of left breast   Histology per Pathology Report:   01/26/17    Receptor Status: ER(95%), PR (100%), Her2-neu (negative), Ki-(10%)  Did patient present with symptoms (if so, please note symptoms) or was this found on screening mammography?: screening mammogram  Past/Anticipated interventions by surgeon, if any: 01/25/17 - left breast lumpectomy  Past/Anticipated interventions by medical oncology, if any: Adjuvant antiestrogen therapy with tamoxifen 20 mg daily 10 years   Lymphedema issues, if any:  no   Pain issues, if any:   Patient has some swelling and discomfort in her left breast.  SAFETY ISSUES:  Prior radiation? no  Pacemaker/ICD? no  Possible current pregnancy?no  Is the patient on methotrexate? no  Current Complaints / other details:  Patient is here with her boyfriend.  BP 139/88 (BP Location: Right Arm, Patient Position: Sitting)   Pulse 71   Temp 98.2 F (36.8 C) (Oral)   Ht 5' 4"  (1.626 m)   Wt 214 lb 3.2 oz (97.2 kg)   SpO2 100%   BMI 36.77 kg/m    Wt Readings from Last 3 Encounters:  02/23/17 214 lb 3.2 oz (97.2 kg)  02/14/17 216 lb 4.8 oz (98.1 kg)  06/27/13 222 lb 3.2 oz (100.8 kg)      Estes Lehner, Craige Cotta, RN 02/17/2017,9:57 AM

## 2017-02-23 ENCOUNTER — Encounter: Payer: Self-pay | Admitting: Radiation Oncology

## 2017-02-23 ENCOUNTER — Ambulatory Visit
Admission: RE | Admit: 2017-02-23 | Discharge: 2017-02-23 | Disposition: A | Discharge status: Home / Self Care | Payer: No Typology Code available for payment source | Source: Ambulatory Visit | Attending: Radiation Oncology | Admitting: Radiation Oncology

## 2017-02-23 DIAGNOSIS — Z79899 Other long term (current) drug therapy: Secondary | ICD-10-CM | POA: Insufficient documentation

## 2017-02-23 DIAGNOSIS — C50212 Malignant neoplasm of upper-inner quadrant of left female breast: Secondary | ICD-10-CM | POA: Insufficient documentation

## 2017-02-23 DIAGNOSIS — Z510 Encounter for antineoplastic radiation therapy: Secondary | ICD-10-CM | POA: Diagnosis present

## 2017-02-23 DIAGNOSIS — Z170 Estrogen receptor positive status [ER+]: Secondary | ICD-10-CM | POA: Insufficient documentation

## 2017-02-23 DIAGNOSIS — F329 Major depressive disorder, single episode, unspecified: Secondary | ICD-10-CM | POA: Insufficient documentation

## 2017-02-23 DIAGNOSIS — I10 Essential (primary) hypertension: Secondary | ICD-10-CM | POA: Diagnosis not present

## 2017-02-23 HISTORY — DX: Malignant neoplasm of unspecified site of left female breast: C50.912

## 2017-02-23 NOTE — Progress Notes (Signed)
Radiation Oncology         [phone] ________________________________  Initial Outpatient Consultation  Name: Tasha Thomas MRN: [id number]  Date: 02/23/2017  DOB: [birth year]  ZG:YFVC, Gara Kroner, MD  Marylu Lund., MD   REFERRING PHYSICIAN: Marylu Lund., MD  DIAGNOSIS: The encounter diagnosis was Malignant neoplasm of upper-inner quadrant of left breast in female, estrogen receptor positive ([place]). 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   HISTORY OF PRESENT ILLNESS::Tasha Thomas is a 51 y.o. female who is kindly referred to our clinic by Dr. Noberto Retort. The patient originally presented for routine screening mammogram which showed an abnormality in the left breast. Left breast biopsy revealed invasive ductal carcinoma with DCIS, grade 1, ER/PR positive, HER-2 negative, Ki-67 10%.   The patient underwent left breast lumpectomy and sentinel node procedure on 01/25/17 at [facility]. Final pathology showed invasive ductal carcinoma, grade I, and DCIS, low to intermediate grade, 9 mm lesion. ER 95%, PR 100%, HER-2 negative, Ki-67 10%. Also seen were fibrocystic and fibroadenomatoid changes. Margins free of neoplasm. Two sentinel node biopsies were performed, both of which were negative for metastatic carcinoma.   She met with medical oncologist Dr. Lindi Adie on 02/14/17 where adjuvant antiestrogen therapy was recommended. No family history of breast cancer that she is aware of.  The patient is here for further evaluation and discussion of radiation treatment options in the management of her disease.  PREVIOUS RADIATION THERAPY: No  PAST MEDICAL HISTORY:  has a past medical history of Breast cancer, left ([place]); Depression; and Hypertension.    PAST SURGICAL HISTORY: Past Surgical History:  Procedure Laterality Date  . ABCESS DRAINAGE Left    left breast  when patient was 21  . BREAST LUMPECTOMY Left 01/25/2017    FAMILY HISTORY: family history includes Lung cancer in her paternal grandfather; Lupus in her maternal aunt.  SOCIAL HISTORY:  reports that she has never smoked. She has never used smokeless tobacco. She reports that she drinks alcohol. She reports that she does not use drugs.  ALLERGIES: Erythromycin  MEDICATIONS:  Current Outpatient Prescriptions  Medication Sig Dispense Refill  . amLODipine (NORVASC) 5 MG tablet Take 5 mg by mouth daily.    . cetirizine (ZYRTEC) 10 MG tablet Take 10 mg by mouth.    . furosemide (LASIX) 20 MG tablet Take 20 mg by mouth daily.    Marland Kitchen levonorgestrel (MIRENA) 20 MCG/24HR IUD by Intrauterine route.    Earney Navy Bicarbonate (ZEGERID OTC PO) Take 1 tablet by mouth daily.    . fexofenadine (ALLEGRA) 180 MG tablet Take 180 mg by mouth daily.    Marland Kitchen gabapentin (NEURONTIN) 300 MG capsule Take 1 capsule (300 mg total) by mouth 2 (two) times daily. (Patient not taking: Reported on 02/23/2017) 60 capsule 3  . mometasone (NASONEX) 50 MCG/ACT nasal spray Place 2 sprays into the nose daily.    Marland Kitchen SALINE NA Place into the nose daily.     No current facility-administered medications for this encounter.     REVIEW OF SYSTEMS:  On review of systems, the patient reports that she is doing well overall. She is accompanied by her boyfriend. She denies any issues with healing or infection after lumpectomy. She denies lymphedema issues. She does note some swelling and discomfort in her left breast. She is using Mirena birth control and is scheduled to have it removed on 03/07/17. She  denies any chest pain, shortness of breath, cough, fevers, chills, night sweats, unintended weight changes. She denies any bowel or bladder disturbances, and denies abdominal pain, nausea or vomiting. She denies any new musculoskeletal or joint aches or pains, new skin lesions or concerns. 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  Patient states receiving treatment in [place] would be more convenient for her. A complete review of systems is obtained and is otherwise negative.  REVIEW OF SYSTEMS: A 10+ POINT REVIEW OF SYSTEMS WAS OBTAINED including neurology, dermatology, psychiatry, cardiac, respiratory, lymph, extremities, GI, GU, musculoskeletal, constitutional, reproductive, HEENT. All pertinent positives are noted in the HPI. All others are negative.   PHYSICAL EXAM:  height is _0  (1.626 m) and weight is 214 lb 3.2 oz (97.2 kg). Her oral temperature is 98.2 F (36.8 C). Her blood pressure is 139/88 and her pulse is 71. Her oxygen saturation is 100%.   General: Alert and oriented, in no acute distress, accompanied by boyfriend HEENT: Head is normocephalic. Extraocular movements are intact. Oropharynx is clear. Neck: Neck is supple, no palpable cervical or supraclavicular lymphadenopathy. Heart: Regular in rate and rhythm with no murmurs, rubs, or gallops. Chest: Clear to auscultation bilaterally, with no rhonchi, wheezes, or rales. Abdomen: Soft, nontender, nondistended, with no rigidity or guarding. Extremities: No cyanosis or edema. Lymphatics: see Neck Exam Skin: No concerning lesions. Musculoskeletal: symmetric strength and muscle tone throughout. Neurologic: Cranial nerves II through XII are grossly intact. No obvious focalities. Speech is fluent. Coordination is intact. Psychiatric: Judgment and insight are intact. Affect is appropriate. Breast: Right breast shows no palpable mass or nipple discharge. Left breast has a well healing scar in the upper inner quadrant without signs of drainage or infection. Patient has also a scar in the left axillary region without signs of drainage or infection. No dominant mass appreciated in the left breast, and no nipple discharge or bleeding.   ECOG = 1  0 - Asymptomatic (Fully active, able to carry on all predisease activities without restriction)  1 - Symptomatic  but completely ambulatory (Restricted in physically strenuous activity but ambulatory and able to carry out work of a light or sedentary nature. For example, light housework, office work)  2 - Symptomatic, <50% in bed during the day (Ambulatory and capable of all self care but unable to carry out any work activities. Up and about more than 50% of waking hours)  3 - Symptomatic, >50% in bed, but not bedbound (Capable of only limited self-care, confined to bed or chair 50% or more of waking hours)  4 - Bedbound (Completely disabled. Cannot carry on any self-care. Totally confined to bed or chair)  5 - Death   Eustace Pen MM, Creech RH, Tormey DC, et al. [phone]). "Toxicity and response criteria of the [facility]". [facility] Oncol. 5 (6): 649-55  LABORATORY DATA:  No results found for: WBC, HGB, HCT, MCV, PLT, NEUTROABS No results found for: NA, K, CL, CO2, GLUCOSE, CREATININE, CALCIUM    RADIOGRAPHY: No results found.    IMPRESSION: 51 year-old woman with Stage IA (pT1b, pN0) invasive ductal carcinoma of the left breast, low to intermediate grade, ER/PR+, HER-2 negative, s/p lumpectomy.   Patient is having her Mirena IUD removed later this month.   We discussed the natural history of breast cancer and general treatment, highlighting the role of radiotherapy in the management.  We discussed the available radiation techniques, and focused on the details of logistics and delivery.  We reviewed the anticipated acute and late sequelae associated with radiation in this setting.  The patient was encouraged to ask questions that I answered to the best of my ability. The patient would like to proceed with radiation and will be scheduled for CT simulation.  PLAN: She will proceed with CT simulation on Monday, October 22nd at 9 am. The patient will receive approximately 6 1/2 weeks of radiation therapy.      ------------------------------------------------  Blair Promise, PhD,  MD  This document serves as a record of services personally performed by Gery Pray, MD. It was created on his behalf by Arlyce Harman, a trained medical scribe. The creation of this record is based on the scribe's personal observations and the provider's statements to them. This document has been checked and approved by the attending provider.

## 2017-02-27 DIAGNOSIS — C50912 Malignant neoplasm of unspecified site of left female breast: Secondary | ICD-10-CM

## 2017-02-27 HISTORY — DX: Malignant neoplasm of unspecified site of left female breast: C50.912

## 2017-02-28 ENCOUNTER — Ambulatory Visit
Admission: RE | Admit: 2017-02-28 | Discharge: 2017-02-28 | Disposition: A | Discharge status: Home / Self Care | Payer: No Typology Code available for payment source | Source: Ambulatory Visit | Attending: Radiation Oncology | Admitting: Radiation Oncology

## 2017-02-28 DIAGNOSIS — Z170 Estrogen receptor positive status [ER+]: Principal | ICD-10-CM

## 2017-02-28 DIAGNOSIS — C50212 Malignant neoplasm of upper-inner quadrant of left female breast: Secondary | ICD-10-CM

## 2017-02-28 DIAGNOSIS — Z510 Encounter for antineoplastic radiation therapy: Secondary | ICD-10-CM | POA: Diagnosis not present

## 2017-02-28 NOTE — Progress Notes (Signed)
  Radiation Oncology         [id number]) [phone] ________________________________  Name: Keyanni Whittinghill MRN: [id number]  Date: 02/28/2017  DOB: 1965-05-14  SIMULATION AND TREATMENT PLANNING NOTE   DIAGNOSIS:  51 year-old woman with Stage IA (pT1b, pN0) invasive ductal carcinoma of the left breast, low to intermediate grade, ER/PR+, HER-2 negative, s/p lumpectomy.   NARRATIVE:  The patient was brought to the [facility].  Identity was confirmed.  All relevant records and images related to the planned course of therapy were reviewed.  The patient freely provided informed written consent to proceed with treatment after reviewing the details related to the planned course of therapy. The consent form was witnessed and verified by the simulation staff.  Then, the patient was set-up in a stable reproducible  supine position for radiation therapy.  CT images were obtained.  Surface markings were placed.  The CT images were loaded into the planning software.  Then the target and avoidance structures were contoured.  Treatment planning then occurred.  The radiation prescription was entered and confirmed.  Then, I designed and supervised the construction of a total of 3 medically necessary complex treatment devices.  I have requested : 3D Simulation  I have requested a DVH of the following structures: Lumpectomy cavity, heart and lungs.  I have ordered: Dose calc  PLAN:  The patient will receive 50.4 Gy in 28 fractions. Followed by a boost to the lumpectomy cavity of 10 Gy in 5 fractions, for a cumulative dose of 60.4 Gy.  -----------------------------------  Blair Promise, PhD, MD  This document serves as a record of services personally performed by Gery Pray, MD. It was created on his behalf by Arlyce Harman, a trained medical scribe. The creation of this record is based on the scribe's personal observations and the provider's statements to them. This document has been checked and  approved by the attending provider.

## 2017-03-02 DIAGNOSIS — Z510 Encounter for antineoplastic radiation therapy: Secondary | ICD-10-CM | POA: Diagnosis not present

## 2017-03-03 ENCOUNTER — Telehealth: Payer: Self-pay | Admitting: Hematology and Oncology

## 2017-03-03 NOTE — Telephone Encounter (Signed)
Scheduled appt per 10/25 sch message - left message with appt date and time.

## 2017-03-07 ENCOUNTER — Ambulatory Visit
Admission: RE | Admit: 2017-03-07 | Discharge: 2017-03-07 | Disposition: A | Discharge status: Home / Self Care | Payer: No Typology Code available for payment source | Source: Ambulatory Visit | Attending: Radiation Oncology | Admitting: Radiation Oncology

## 2017-03-07 DIAGNOSIS — Z170 Estrogen receptor positive status [ER+]: Principal | ICD-10-CM

## 2017-03-07 DIAGNOSIS — C50212 Malignant neoplasm of upper-inner quadrant of left female breast: Secondary | ICD-10-CM

## 2017-03-07 DIAGNOSIS — Z510 Encounter for antineoplastic radiation therapy: Secondary | ICD-10-CM | POA: Diagnosis not present

## 2017-03-07 NOTE — Progress Notes (Signed)
  Radiation Oncology         [id number]) [phone] ________________________________  Name: Tasha Thomas MRN: [id number]  Date: 03/07/2017  DOB: 1966-04-06  Simulation Verification Note    ICD-10-CM   1. Malignant neoplasm of upper-inner quadrant of left breast in female, estrogen receptor positive ([place]) C50.212    Z17.0     Status: outpatient  NARRATIVE: The patient was brought to the treatment unit and placed in the planned treatment position. The clinical setup was verified. Then port films were obtained and uploaded to the radiation oncology medical record software.  The treatment beams were carefully compared against the planned radiation fields. The position location and shape of the radiation fields was reviewed. They targeted volume of tissue appears to be appropriately covered by the radiation beams. Organs at risk appear to be excluded as planned.  Based on my personal review, I approved the simulation verification. The patient's treatment will proceed as planned.  -----------------------------------  Blair Promise, PhD, MD

## 2017-03-08 ENCOUNTER — Ambulatory Visit: Payer: 59 | Admitting: Radiation Oncology

## 2017-03-08 ENCOUNTER — Ambulatory Visit
Admission: RE | Admit: 2017-03-08 | Discharge: 2017-03-08 | Disposition: A | Discharge status: Home / Self Care | Payer: No Typology Code available for payment source | Source: Ambulatory Visit | Attending: Radiation Oncology | Admitting: Radiation Oncology

## 2017-03-08 ENCOUNTER — Ambulatory Visit: Payer: No Typology Code available for payment source | Admitting: Radiation Oncology

## 2017-03-08 DIAGNOSIS — C50212 Malignant neoplasm of upper-inner quadrant of left female breast: Secondary | ICD-10-CM

## 2017-03-08 DIAGNOSIS — Z510 Encounter for antineoplastic radiation therapy: Secondary | ICD-10-CM | POA: Diagnosis not present

## 2017-03-08 DIAGNOSIS — Z170 Estrogen receptor positive status [ER+]: Principal | ICD-10-CM

## 2017-03-08 MED ORDER — RADIAPLEXRX EX GEL
Freq: Once | CUTANEOUS | Status: AC
  Administered 2017-03-08: 16:00:00 via TOPICAL

## 2017-03-08 MED ORDER — ALRA NON-METALLIC DEODORANT (RAD-ONC)
1.0000 "application " | Freq: Once | TOPICAL | Status: AC
  Administered 2017-03-08: 1 via TOPICAL

## 2017-03-08 NOTE — Progress Notes (Signed)
Pt here for patient teaching.    Pt given Radiation and You booklet, skin care instructions, Alra deodorant and Radiaplex gel.    Reviewed areas of pertinence such as fatigue, hair loss, skin changes, breast tenderness and breast swelling .   Pt able to give teach back of to pat skin and use unscented/gentle soap,apply Radiaplex bid, avoid applying anything to skin within 4 hours of treatment, avoid wearing an under wire bra and to use an electric razor if they must shave.   Pt demonstrated understanding of information given and will contact nursing with any questions or concerns.    Http://[email or web address]/treatmentinformation/whattoexpect/index         

## 2017-03-09 ENCOUNTER — Ambulatory Visit: Payer: No Typology Code available for payment source | Admitting: Radiation Oncology

## 2017-03-09 ENCOUNTER — Ambulatory Visit
Admission: RE | Admit: 2017-03-09 | Discharge: 2017-03-09 | Disposition: A | Discharge status: Home / Self Care | Payer: No Typology Code available for payment source | Source: Ambulatory Visit | Attending: Radiation Oncology | Admitting: Radiation Oncology

## 2017-03-09 DIAGNOSIS — Z510 Encounter for antineoplastic radiation therapy: Secondary | ICD-10-CM | POA: Diagnosis not present

## 2017-03-10 ENCOUNTER — Ambulatory Visit: Payer: No Typology Code available for payment source | Admitting: Radiation Oncology

## 2017-03-10 ENCOUNTER — Ambulatory Visit
Admission: RE | Admit: 2017-03-10 | Discharge: 2017-03-10 | Disposition: A | Discharge status: Home / Self Care | Payer: No Typology Code available for payment source | Source: Ambulatory Visit | Attending: Radiation Oncology | Admitting: Radiation Oncology

## 2017-03-10 DIAGNOSIS — Z510 Encounter for antineoplastic radiation therapy: Secondary | ICD-10-CM | POA: Diagnosis not present

## 2017-03-11 ENCOUNTER — Ambulatory Visit
Admission: RE | Admit: 2017-03-11 | Discharge: 2017-03-11 | Disposition: A | Discharge status: Home / Self Care | Payer: No Typology Code available for payment source | Source: Ambulatory Visit | Attending: Radiation Oncology | Admitting: Radiation Oncology

## 2017-03-11 ENCOUNTER — Ambulatory Visit: Payer: No Typology Code available for payment source | Admitting: Radiation Oncology

## 2017-03-11 DIAGNOSIS — Z510 Encounter for antineoplastic radiation therapy: Secondary | ICD-10-CM | POA: Diagnosis not present

## 2017-03-14 ENCOUNTER — Ambulatory Visit: Payer: No Typology Code available for payment source | Admitting: Radiation Oncology

## 2017-03-14 ENCOUNTER — Ambulatory Visit
Admission: RE | Admit: 2017-03-14 | Discharge: 2017-03-14 | Disposition: A | Discharge status: Home / Self Care | Payer: No Typology Code available for payment source | Source: Ambulatory Visit | Attending: Radiation Oncology | Admitting: Radiation Oncology

## 2017-03-14 DIAGNOSIS — Z510 Encounter for antineoplastic radiation therapy: Secondary | ICD-10-CM | POA: Diagnosis not present

## 2017-03-15 ENCOUNTER — Ambulatory Visit
Admission: RE | Admit: 2017-03-15 | Discharge: 2017-03-15 | Disposition: A | Discharge status: Home / Self Care | Payer: No Typology Code available for payment source | Source: Ambulatory Visit | Attending: Radiation Oncology | Admitting: Radiation Oncology

## 2017-03-15 ENCOUNTER — Ambulatory Visit: Payer: No Typology Code available for payment source | Admitting: Radiation Oncology

## 2017-03-15 DIAGNOSIS — Z510 Encounter for antineoplastic radiation therapy: Secondary | ICD-10-CM | POA: Diagnosis not present

## 2017-03-16 ENCOUNTER — Ambulatory Visit
Admission: RE | Admit: 2017-03-16 | Discharge: 2017-03-16 | Disposition: A | Discharge status: Home / Self Care | Payer: No Typology Code available for payment source | Source: Ambulatory Visit | Attending: Radiation Oncology | Admitting: Radiation Oncology

## 2017-03-16 ENCOUNTER — Ambulatory Visit: Payer: No Typology Code available for payment source | Admitting: Radiation Oncology

## 2017-03-16 DIAGNOSIS — Z510 Encounter for antineoplastic radiation therapy: Secondary | ICD-10-CM | POA: Diagnosis not present

## 2017-03-17 ENCOUNTER — Ambulatory Visit
Admission: RE | Admit: 2017-03-17 | Discharge: 2017-03-17 | Disposition: A | Discharge status: Home / Self Care | Payer: No Typology Code available for payment source | Source: Ambulatory Visit | Attending: Radiation Oncology | Admitting: Radiation Oncology

## 2017-03-17 ENCOUNTER — Ambulatory Visit: Payer: No Typology Code available for payment source | Admitting: Radiation Oncology

## 2017-03-17 DIAGNOSIS — Z510 Encounter for antineoplastic radiation therapy: Secondary | ICD-10-CM | POA: Diagnosis not present

## 2017-03-18 ENCOUNTER — Ambulatory Visit
Admission: RE | Admit: 2017-03-18 | Discharge: 2017-03-18 | Disposition: A | Discharge status: Home / Self Care | Payer: No Typology Code available for payment source | Source: Ambulatory Visit | Attending: Radiation Oncology | Admitting: Radiation Oncology

## 2017-03-18 ENCOUNTER — Ambulatory Visit: Payer: No Typology Code available for payment source | Admitting: Radiation Oncology

## 2017-03-18 DIAGNOSIS — Z510 Encounter for antineoplastic radiation therapy: Secondary | ICD-10-CM | POA: Diagnosis not present

## 2017-03-21 ENCOUNTER — Ambulatory Visit
Admission: RE | Admit: 2017-03-21 | Discharge: 2017-03-21 | Disposition: A | Discharge status: Home / Self Care | Payer: No Typology Code available for payment source | Source: Ambulatory Visit | Attending: Radiation Oncology | Admitting: Radiation Oncology

## 2017-03-21 DIAGNOSIS — Z510 Encounter for antineoplastic radiation therapy: Secondary | ICD-10-CM | POA: Diagnosis not present

## 2017-03-22 ENCOUNTER — Ambulatory Visit
Admission: RE | Admit: 2017-03-22 | Discharge: 2017-03-22 | Disposition: A | Discharge status: Home / Self Care | Payer: No Typology Code available for payment source | Source: Ambulatory Visit | Attending: Radiation Oncology | Admitting: Radiation Oncology

## 2017-03-22 DIAGNOSIS — Z510 Encounter for antineoplastic radiation therapy: Secondary | ICD-10-CM | POA: Diagnosis not present

## 2017-03-23 ENCOUNTER — Ambulatory Visit
Admission: RE | Admit: 2017-03-23 | Discharge: 2017-03-23 | Disposition: A | Discharge status: Home / Self Care | Payer: No Typology Code available for payment source | Source: Ambulatory Visit | Attending: Radiation Oncology | Admitting: Radiation Oncology

## 2017-03-23 DIAGNOSIS — Z510 Encounter for antineoplastic radiation therapy: Secondary | ICD-10-CM | POA: Diagnosis not present

## 2017-03-24 ENCOUNTER — Ambulatory Visit
Admission: RE | Admit: 2017-03-24 | Discharge: 2017-03-24 | Disposition: A | Discharge status: Home / Self Care | Payer: No Typology Code available for payment source | Source: Ambulatory Visit | Attending: Radiation Oncology | Admitting: Radiation Oncology

## 2017-03-24 DIAGNOSIS — Z510 Encounter for antineoplastic radiation therapy: Secondary | ICD-10-CM | POA: Diagnosis not present

## 2017-03-25 ENCOUNTER — Ambulatory Visit
Admission: RE | Admit: 2017-03-25 | Discharge: 2017-03-25 | Disposition: A | Discharge status: Home / Self Care | Payer: No Typology Code available for payment source | Source: Ambulatory Visit | Attending: Radiation Oncology | Admitting: Radiation Oncology

## 2017-03-25 DIAGNOSIS — Z510 Encounter for antineoplastic radiation therapy: Secondary | ICD-10-CM | POA: Diagnosis not present

## 2017-03-27 ENCOUNTER — Ambulatory Visit
Admission: RE | Admit: 2017-03-27 | Discharge: 2017-03-27 | Disposition: A | Discharge status: Home / Self Care | Payer: No Typology Code available for payment source | Source: Ambulatory Visit | Attending: Radiation Oncology | Admitting: Radiation Oncology

## 2017-03-27 DIAGNOSIS — Z510 Encounter for antineoplastic radiation therapy: Secondary | ICD-10-CM | POA: Diagnosis not present

## 2017-03-28 ENCOUNTER — Ambulatory Visit
Admission: RE | Admit: 2017-03-28 | Discharge: 2017-03-28 | Disposition: A | Discharge status: Home / Self Care | Payer: No Typology Code available for payment source | Source: Ambulatory Visit | Attending: Radiation Oncology | Admitting: Radiation Oncology

## 2017-03-28 DIAGNOSIS — Z510 Encounter for antineoplastic radiation therapy: Secondary | ICD-10-CM | POA: Diagnosis not present

## 2017-03-29 ENCOUNTER — Ambulatory Visit
Admission: RE | Admit: 2017-03-29 | Discharge: 2017-03-29 | Disposition: A | Discharge status: Home / Self Care | Payer: No Typology Code available for payment source | Source: Ambulatory Visit | Attending: Radiation Oncology | Admitting: Radiation Oncology

## 2017-03-29 DIAGNOSIS — Z510 Encounter for antineoplastic radiation therapy: Secondary | ICD-10-CM | POA: Diagnosis not present

## 2017-03-30 ENCOUNTER — Ambulatory Visit
Admission: RE | Admit: 2017-03-30 | Discharge: 2017-03-30 | Disposition: A | Discharge status: Home / Self Care | Payer: No Typology Code available for payment source | Source: Ambulatory Visit | Attending: Radiation Oncology | Admitting: Radiation Oncology

## 2017-03-30 DIAGNOSIS — Z510 Encounter for antineoplastic radiation therapy: Secondary | ICD-10-CM | POA: Diagnosis not present

## 2017-04-04 ENCOUNTER — Ambulatory Visit
Admission: RE | Admit: 2017-04-04 | Discharge: 2017-04-04 | Disposition: A | Discharge status: Home / Self Care | Payer: No Typology Code available for payment source | Source: Ambulatory Visit | Attending: Radiation Oncology | Admitting: Radiation Oncology

## 2017-04-04 DIAGNOSIS — Z510 Encounter for antineoplastic radiation therapy: Secondary | ICD-10-CM | POA: Diagnosis not present

## 2017-04-05 ENCOUNTER — Ambulatory Visit
Admission: RE | Admit: 2017-04-05 | Discharge: 2017-04-05 | Disposition: A | Discharge status: Home / Self Care | Payer: No Typology Code available for payment source | Source: Ambulatory Visit | Attending: Radiation Oncology | Admitting: Radiation Oncology

## 2017-04-05 DIAGNOSIS — Z510 Encounter for antineoplastic radiation therapy: Secondary | ICD-10-CM | POA: Diagnosis not present

## 2017-04-06 ENCOUNTER — Ambulatory Visit
Admission: RE | Admit: 2017-04-06 | Discharge: 2017-04-06 | Disposition: A | Discharge status: Home / Self Care | Payer: No Typology Code available for payment source | Source: Ambulatory Visit | Attending: Radiation Oncology | Admitting: Radiation Oncology

## 2017-04-06 DIAGNOSIS — Z510 Encounter for antineoplastic radiation therapy: Secondary | ICD-10-CM | POA: Diagnosis not present

## 2017-04-07 ENCOUNTER — Ambulatory Visit
Admission: RE | Admit: 2017-04-07 | Discharge: 2017-04-07 | Disposition: A | Discharge status: Home / Self Care | Payer: No Typology Code available for payment source | Source: Ambulatory Visit | Attending: Radiation Oncology | Admitting: Radiation Oncology

## 2017-04-07 DIAGNOSIS — Z510 Encounter for antineoplastic radiation therapy: Secondary | ICD-10-CM | POA: Diagnosis not present

## 2017-04-08 ENCOUNTER — Ambulatory Visit
Admission: RE | Admit: 2017-04-08 | Discharge: 2017-04-08 | Disposition: A | Discharge status: Home / Self Care | Payer: No Typology Code available for payment source | Source: Ambulatory Visit | Attending: Radiation Oncology | Admitting: Radiation Oncology

## 2017-04-08 DIAGNOSIS — Z510 Encounter for antineoplastic radiation therapy: Secondary | ICD-10-CM | POA: Diagnosis not present

## 2017-04-11 ENCOUNTER — Ambulatory Visit
Admission: RE | Admit: 2017-04-11 | Discharge: 2017-04-11 | Disposition: A | Discharge status: Home / Self Care | Payer: No Typology Code available for payment source | Source: Ambulatory Visit | Attending: Radiation Oncology | Admitting: Radiation Oncology

## 2017-04-11 DIAGNOSIS — Z510 Encounter for antineoplastic radiation therapy: Secondary | ICD-10-CM | POA: Diagnosis not present

## 2017-04-12 ENCOUNTER — Ambulatory Visit
Admission: RE | Admit: 2017-04-12 | Discharge: 2017-04-12 | Disposition: A | Discharge status: Home / Self Care | Payer: No Typology Code available for payment source | Source: Ambulatory Visit | Attending: Radiation Oncology | Admitting: Radiation Oncology

## 2017-04-12 DIAGNOSIS — Z510 Encounter for antineoplastic radiation therapy: Secondary | ICD-10-CM | POA: Diagnosis not present

## 2017-04-13 ENCOUNTER — Ambulatory Visit
Admission: RE | Admit: 2017-04-13 | Discharge: 2017-04-13 | Disposition: A | Discharge status: Home / Self Care | Payer: No Typology Code available for payment source | Source: Ambulatory Visit | Attending: Radiation Oncology | Admitting: Radiation Oncology

## 2017-04-13 DIAGNOSIS — Z510 Encounter for antineoplastic radiation therapy: Secondary | ICD-10-CM | POA: Diagnosis not present

## 2017-04-14 ENCOUNTER — Ambulatory Visit
Admission: RE | Admit: 2017-04-14 | Discharge: 2017-04-14 | Disposition: A | Discharge status: Home / Self Care | Payer: No Typology Code available for payment source | Source: Ambulatory Visit | Attending: Radiation Oncology | Admitting: Radiation Oncology

## 2017-04-14 DIAGNOSIS — Z510 Encounter for antineoplastic radiation therapy: Secondary | ICD-10-CM | POA: Diagnosis not present

## 2017-04-15 ENCOUNTER — Ambulatory Visit
Admission: RE | Admit: 2017-04-15 | Discharge: 2017-04-15 | Disposition: A | Discharge status: Home / Self Care | Payer: No Typology Code available for payment source | Source: Ambulatory Visit | Attending: Radiation Oncology | Admitting: Radiation Oncology

## 2017-04-15 DIAGNOSIS — Z510 Encounter for antineoplastic radiation therapy: Secondary | ICD-10-CM | POA: Diagnosis not present

## 2017-04-18 ENCOUNTER — Ambulatory Visit: Payer: No Typology Code available for payment source

## 2017-04-19 ENCOUNTER — Ambulatory Visit
Admission: RE | Admit: 2017-04-19 | Discharge: 2017-04-19 | Disposition: A | Discharge status: Home / Self Care | Payer: No Typology Code available for payment source | Source: Ambulatory Visit | Attending: Radiation Oncology | Admitting: Radiation Oncology

## 2017-04-19 ENCOUNTER — Ambulatory Visit: Payer: No Typology Code available for payment source

## 2017-04-19 DIAGNOSIS — Z510 Encounter for antineoplastic radiation therapy: Secondary | ICD-10-CM | POA: Diagnosis not present

## 2017-04-20 ENCOUNTER — Telehealth: Payer: Self-pay | Admitting: Hematology and Oncology

## 2017-04-20 ENCOUNTER — Ambulatory Visit (HOSPITAL_BASED_OUTPATIENT_CLINIC_OR_DEPARTMENT_OTHER): Payer: 59 | Admitting: Hematology and Oncology

## 2017-04-20 ENCOUNTER — Other Ambulatory Visit: Payer: Self-pay | Admitting: Hematology and Oncology

## 2017-04-20 ENCOUNTER — Ambulatory Visit
Admission: RE | Admit: 2017-04-20 | Discharge: 2017-04-20 | Disposition: A | Discharge status: Home / Self Care | Payer: No Typology Code available for payment source | Source: Ambulatory Visit | Attending: Radiation Oncology | Admitting: Radiation Oncology

## 2017-04-20 DIAGNOSIS — C50212 Malignant neoplasm of upper-inner quadrant of left female breast: Secondary | ICD-10-CM | POA: Diagnosis not present

## 2017-04-20 DIAGNOSIS — Z170 Estrogen receptor positive status [ER+]: Secondary | ICD-10-CM

## 2017-04-20 DIAGNOSIS — Z510 Encounter for antineoplastic radiation therapy: Secondary | ICD-10-CM | POA: Diagnosis not present

## 2017-04-20 MED ORDER — TAMOXIFEN CITRATE 20 MG PO TABS: 20.0000 mg | ORAL_TABLET | Freq: Every day | ORAL | 3 refills | Status: DC

## 2017-04-20 NOTE — Progress Notes (Signed)
Patient Care Team: Ronita Hipps, MD as PCP - General (Family Medicine)  DIAGNOSIS:  Encounter Diagnosis  Name Primary?  . Malignant neoplasm of upper-inner quadrant of left breast in female, estrogen receptor positive ([place])     SUMMARY OF ONCOLOGIC HISTORY:   Malignant neoplasm of upper-inner quadrant of left breast in female, estrogen receptor positive ([place])   01/25/2017 Surgery    Left lumpectomy: IDC grade 1 and DCIS, 9 mmsize, margins negative, 0/2 lymph nodes negative, ER 95%, PR 100 %, HER-2 negative, Ki-67 10%T1b N0 stage IA      03/08/2017 - 04/20/2017 Radiation Therapy    Adjuvant radiation       CHIEF COMPLIANT: Ongoing radiation therapy, here to discuss the future treatment plan  INTERVAL HISTORY: Tasha Thomas is a 51 year old with above-mentioned history of left breast cancer treated with lumpectomy and radiation.  She is finally come to the last week of her radiation.  She reports skin changes related to radiation.  She also has some fatigue issues.  She has noticed that her blood pressure has been increasing lately.  REVIEW OF SYSTEMS:   Constitutional: Denies fevers, chills or abnormal weight loss Eyes: Denies blurriness of vision Ears, nose, mouth, throat, and face: Denies mucositis or sore throat Respiratory: Denies cough, dyspnea or wheezes Cardiovascular: Denies palpitation, chest discomfort Gastrointestinal:  Denies nausea, heartburn or change in bowel habits Skin: Denies abnormal skin rashes Lymphatics: Denies new lymphadenopathy or easy bruising Neurological:Denies numbness, tingling or new weaknesses Behavioral/Psych: Mood is stable, no new changes  Extremities: No lower extremity edema Breast: Radiation dermatitis All other systems were reviewed with the patient and are negative.  I have reviewed the past medical history, past surgical history, social history and family history with the patient and they are unchanged from previous  note.  ALLERGIES:  is allergic to erythromycin.  MEDICATIONS:  Current Outpatient Medications  Medication Sig Dispense Refill  . amLODipine (NORVASC) 5 MG tablet Take 5 mg by mouth daily.    . cetirizine (ZYRTEC) 10 MG tablet Take 10 mg by mouth.    . fexofenadine (ALLEGRA) 180 MG tablet Take 180 mg by mouth daily.    . furosemide (LASIX) 20 MG tablet Take 20 mg by mouth daily.    Marland Kitchen gabapentin (NEURONTIN) 300 MG capsule Take 1 capsule (300 mg total) by mouth 2 (two) times daily. (Patient not taking: Reported on 02/23/2017) 60 capsule 3  . levonorgestrel (MIRENA) 20 MCG/24HR IUD by Intrauterine route.    . mometasone (NASONEX) 50 MCG/ACT nasal spray Place 2 sprays into the nose daily.    Earney Navy Bicarbonate (ZEGERID OTC PO) Take 1 tablet by mouth daily.    Marland Kitchen SALINE NA Place into the nose daily.    . tamoxifen (NOLVADEX) 20 MG tablet Take 1 tablet (20 mg total) by mouth daily. 90 tablet 3   No current facility-administered medications for this visit.     PHYSICAL EXAMINATION: ECOG PERFORMANCE STATUS: 1 - Symptomatic but completely ambulatory  Vitals:   04/20/17 1447  BP: (!) 123/91  Pulse: 89  Resp: 18  Temp: 98.2 F (36.8 C)  SpO2: 94%   Filed Weights   04/20/17 1447  Weight: 220 lb (99.8 kg)    GENERAL:alert, no distress and comfortable SKIN: skin color, texture, turgor are normal, no rashes or significant lesions EYES: normal, Conjunctiva are pink and non-injected, sclera clear OROPHARYNX:no exudate, no erythema and lips, buccal mucosa, and tongue normal  NECK: supple, thyroid normal size, non-tender,  without nodularity LYMPH:  no palpable lymphadenopathy in the cervical, axillary or inguinal LUNGS: clear to auscultation and percussion with normal breathing effort HEART: regular rate & rhythm and no murmurs and no lower extremity edema ABDOMEN:abdomen soft, non-tender and normal bowel sounds MUSCULOSKELETAL:no cyanosis of digits and no clubbing  NEURO:  alert & oriented x 3 with fluent speech, no focal motor/sensory deficits EXTREMITIES: No lower extremity edema   LABORATORY DATA:  I have reviewed the data as listed   Chemistry   No results found for: NA, K, CL, CO2, BUN, CREATININE, GLU No results found for: CALCIUM, ALKPHOS, AST, ALT, BILITOT     No results found for: WBC, HGB, HCT, MCV, PLT, NEUTROABS  ASSESSMENT & PLAN:  Malignant neoplasm of upper-inner quadrant of left breast in female, estrogen receptor positive ([place]) 01/26/17: Left lumpectomy: IDC grade 1 and DCIS, 9 mm size, margins negative, 0/2 lymph nodes negative, ER 95%, PR 100 %, HER-2 negative, Ki-67 10%T1b N0 stage IA  Pathology and radiology counseling:Discussed with the patient, the details of pathology including the type of breast cancer,the clinical staging, the significance of ER, PR and HER-2/neu receptors and the implications for treatment. After reviewing the pathology in detail, we proceeded to discuss the different treatment options between radiation, chemotherapy, antiestrogen therapies.  Patient's cancer has extremely favorable characteristics.. She has low-grade low proliferation rate, strongly ER/PR positivity and the size of 0.9 cm. Because of this I did not recommend doing molecular testing.  Recommendations: 1. Adjuvant radiation therapy 03/08/2017-04/20/2017 2. Adjuvant antiestrogen therapy with tamoxifen 20 mg daily 10 years versus switching her after she becomes menopausal to anastrozole.  Patient will initiate antiestrogen therapy with tamoxifen starting January 2019. Return to clinic in April 2019 for survivorship care plan visit    I spent 25 minutes talking to the patient of which more than half was spent in counseling and coordination of care.  No orders of the defined types were placed in this encounter.  The patient has a good understanding of the overall plan. she agrees with it. she will call with any problems that may develop before  the next visit here.   Rulon Eisenmenger, MD 04/20/17

## 2017-04-20 NOTE — Telephone Encounter (Signed)
Gave patient AVs ans calendar of upcoming April 2019 appointments.

## 2017-04-20 NOTE — Assessment & Plan Note (Signed)
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 After reviewing the pathology in detail, we proceeded to discuss the different treatment options between radiation, chemotherapy, antiestrogen therapies.  Patient's cancer has extremely favorable characteristics.. She has low-grade low proliferation rate, strongly ER/PR positivity and the size of 0.9 cm. Because of this I did not recommend doing molecular testing.  Recommendations: 1. Adjuvant radiation therapy 03/08/2017-04/20/2017 2. Adjuvant antiestrogen therapy with tamoxifen 20 mg daily 10 years versus switching her after she becomes menopausal to anastrozole.  Patient will initiate antiestrogen therapy with tamoxifen starting January 2019. Return to clinic in April 2019 for survivorship care plan visit

## 2017-04-21 ENCOUNTER — Ambulatory Visit: Payer: No Typology Code available for payment source

## 2017-04-21 ENCOUNTER — Ambulatory Visit
Admission: RE | Admit: 2017-04-21 | Discharge: 2017-04-21 | Disposition: A | Discharge status: Home / Self Care | Payer: No Typology Code available for payment source | Source: Ambulatory Visit | Attending: Radiation Oncology | Admitting: Radiation Oncology

## 2017-04-21 DIAGNOSIS — Z510 Encounter for antineoplastic radiation therapy: Secondary | ICD-10-CM | POA: Diagnosis not present

## 2017-04-22 ENCOUNTER — Ambulatory Visit: Payer: No Typology Code available for payment source

## 2017-04-22 ENCOUNTER — Ambulatory Visit
Admission: RE | Admit: 2017-04-22 | Discharge: 2017-04-22 | Disposition: A | Discharge status: Home / Self Care | Payer: No Typology Code available for payment source | Source: Ambulatory Visit | Attending: Radiation Oncology | Admitting: Radiation Oncology

## 2017-04-22 DIAGNOSIS — Z510 Encounter for antineoplastic radiation therapy: Secondary | ICD-10-CM | POA: Diagnosis not present

## 2017-04-23 ENCOUNTER — Ambulatory Visit
Admission: RE | Admit: 2017-04-23 | Discharge: 2017-04-23 | Disposition: A | Discharge status: Home / Self Care | Payer: No Typology Code available for payment source | Source: Ambulatory Visit | Attending: Radiation Oncology | Admitting: Radiation Oncology

## 2017-04-23 DIAGNOSIS — Z510 Encounter for antineoplastic radiation therapy: Secondary | ICD-10-CM | POA: Diagnosis not present

## 2017-04-25 ENCOUNTER — Ambulatory Visit: Payer: No Typology Code available for payment source

## 2017-04-27 ENCOUNTER — Encounter: Payer: Self-pay | Admitting: Radiation Oncology

## 2017-04-27 NOTE — Progress Notes (Signed)
  Radiation Oncology         [phone]) [phone] ________________________________  Name: Tasha Thomas MRN: [id number]  Date: 04/27/2017  DOB: 22-Apr-1966  End of Treatment Note  Diagnosis:   Stage IA (pT1b, pN0) invasive ductal carcinoma of the left breast, low to intermediate grade, ER/PR+, HER-2 negative, s/p lumpectomy.     Indication for treatment:  Curative, post-op, adjuvant      Radiation treatment dates:   03/08/17-04/23/17  Site/dose:   Left breast  1. Primary 50.4 Gy delivered in 28 fractions 2. Boost 10 Gy delivered in 5 fractions  Beams/energy:   1. Primary 3D / 6X, 10X         2. Boost 3D / 6X, 10X  Narrative: The patient tolerated radiation treatment relatively well. Initially she was asymptomatic. Throughout the course of treatment, the patient developed mild fatigue, hyperpigmentation, itching and peeling of the skin which was controlled by Radiaplex. Otherwise, she is without acute complaints.  Plan: The patient has completed radiation treatment. The patient will return to radiation oncology clinic for routine followup in one month. I advised them to call or return sooner if they have any questions or concerns related to their recovery or treatment.  -----------------------------------  Blair Promise, PhD, MD  This document serves as a record of services personally performed by Gery Pray, MD. It was created on his behalf by Marlowe Kays, a trained medical scribe. The creation of this record is based on the scribe's personal observations and the provider's statements to them. This document has been checked and approved by the attending provider.

## 2017-05-13 ENCOUNTER — Encounter: Payer: Self-pay | Admitting: Hematology and Oncology

## 2017-05-27 ENCOUNTER — Encounter: Payer: Self-pay | Admitting: Oncology

## 2017-05-30 ENCOUNTER — Ambulatory Visit
Admission: RE | Admit: 2017-05-30 | Discharge: 2017-05-30 | Disposition: A | Discharge status: Home / Self Care | Payer: 59 | Source: Ambulatory Visit | Attending: Radiation Oncology | Admitting: Radiation Oncology

## 2017-05-30 ENCOUNTER — Other Ambulatory Visit: Payer: Self-pay

## 2017-05-30 ENCOUNTER — Encounter: Payer: Self-pay | Admitting: Radiation Oncology

## 2017-05-30 DIAGNOSIS — C50212 Malignant neoplasm of upper-inner quadrant of left female breast: Secondary | ICD-10-CM | POA: Diagnosis present

## 2017-05-30 DIAGNOSIS — N644 Mastodynia: Secondary | ICD-10-CM | POA: Diagnosis not present

## 2017-05-30 DIAGNOSIS — Y842 Radiological procedure and radiotherapy as the cause of abnormal reaction of the patient, or of later complication, without mention of misadventure at the time of the procedure: Secondary | ICD-10-CM | POA: Insufficient documentation

## 2017-05-30 DIAGNOSIS — Z79899 Other long term (current) drug therapy: Secondary | ICD-10-CM | POA: Insufficient documentation

## 2017-05-30 DIAGNOSIS — Z923 Personal history of irradiation: Secondary | ICD-10-CM | POA: Insufficient documentation

## 2017-05-30 DIAGNOSIS — Z170 Estrogen receptor positive status [ER+]: Secondary | ICD-10-CM | POA: Diagnosis not present

## 2017-05-30 DIAGNOSIS — Z888 Allergy status to other drugs, medicaments and biological substances status: Secondary | ICD-10-CM | POA: Insufficient documentation

## 2017-05-30 NOTE — Progress Notes (Signed)
Tasha Thomas is here for follow up.  She denies having pain except for occasional sharp pains in her right breast.  She denies having fatigue.  She is taking Tamoxifen.  The skin on her left breast has hyperpigmentation.  BP (!) 130/91 (BP Location: Right Arm, Patient Position: Sitting)   Pulse 90   Temp 98.7 F (37.1 C) (Oral)   Ht 5\' 4"  (1.626 m)   Wt 221 lb 9.6 oz (100.5 kg)   SpO2 98%   BMI 38.04 kg/m    Wt Readings from Last 3 Encounters:  05/30/17 221 lb 9.6 oz (100.5 kg)  04/20/17 220 lb (99.8 kg)  02/23/17 214 lb 3.2 oz (97.2 kg)

## 2017-05-30 NOTE — Progress Notes (Signed)
Radiation Oncology         [phone] ________________________________  Name: Tasha Thomas MRN: [id number]  Date: 05/30/2017  DOB: 06-01-1965  Follow-Up Visit Note  CC: Ronita Hipps, MD  Marylu Lund., MD    ICD-10-CM   1. Malignant neoplasm of upper-inner quadrant of left breast in female, estrogen receptor positive ([place]) C50.212    Z17.0     Diagnosis:   52 y.o. 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 Left Breast / 50.4 Gy in 28 fractions, 2. Left Breast Boost / 10 Gy in 5 fractions  Narrative:  The patient returns today for routine follow-up. The patient tolerated radiation treatment relatively well. Initially, she was asymptomatic. Throughout the course of treatment, the patient developed mild fatigue, hyperpigmentation, itching, and desquamation of the skin which was controlled by Radiaplex. Otherwise, she was without acute complaints.  Today, she denies pain except for occasional sharp pains in her right breast. She denies any upper extremity edema. She denies fatigue. She is taking tamoxifen with some hot flashes. She denies any night sweats. The skin to her left breast still has hyperpigmentation.                            ALLERGIES:  is allergic to erythromycin.  Meds: Current Outpatient Medications  Medication Sig Dispense Refill  . amLODipine (NORVASC) 5 MG tablet Take 5 mg by mouth daily.    . cetirizine (ZYRTEC) 10 MG tablet Take 10 mg by mouth.    Earney Navy Bicarbonate (ZEGERID OTC PO) Take 1 tablet by mouth daily.    . tamoxifen (NOLVADEX) 20 MG tablet Take 1 tablet (20 mg total) by mouth daily. 90 tablet 3  . furosemide (LASIX) 20 MG tablet Take 20 mg by mouth daily.     No current facility-administered medications for this  encounter.     Physical Findings: The patient is in no acute distress. Patient is alert and oriented.  height is _0  (1.626 m) and weight is 221 lb 9.6 oz (100.5 kg). Her oral temperature is 98.7 F (37.1 C). Her blood pressure is 130/91 (abnormal) and her pulse is 90. Her oxygen saturation is 98%.   Lungs are clear to auscultation bilaterally. Heart has regular rate and rhythm. No palpable cervical, supraclavicular, or axillary adenopathy. Abdomen soft, non-tender, normal bowel sounds.  Breast Exam: Right breast has no palpable mass or nipple discharge. Left breast has some hyperpigmentation changes. Skin is well-healed. No palpable mass, nipple discharge, or bleeding in the left breast.  Lab Findings: No results found for: WBC, HGB, HCT, MCV, PLT  Radiographic Findings: No results found.  Impression:  The patient is recovering from the effects of radiation.  No evidence of disease recurrence on clinical exam.   Plan:  PRN follow-up in radiation oncology. She will continue routine follow-up with medical oncology and surgery.   ____________________________________  Blair Promise, PhD, MD  This document serves as a record of services personally performed by Gery Pray, MD. It was created on his behalf by Rae Lips, a trained medical scribe. The creation of this record is based on the scribe's personal observations and the provider's statements to them. This document has been checked and approved by the attending provider.

## 2017-06-30 ENCOUNTER — Telehealth: Payer: Self-pay

## 2017-06-30 NOTE — Telephone Encounter (Signed)
Returned pt call, she is having concerns with some emotional distress and mood swings. She is wanting to know if she can go to her pcp for this situation. I informed her she certainly could we would just like to be informed of what medication they start for her. No further questions at this time.  Cyndia Bent RN

## 2017-07-18 ENCOUNTER — Telehealth: Payer: Self-pay

## 2017-07-18 NOTE — Telephone Encounter (Signed)
Patients PCP is wanting to place her on celexa. She is calling to make sure this is ok to take with her tamoxifen. Informed her she is safe to take this and there is little to no reaction with tamoxifen and celexa. No further questions at this time.  Cyndia Bent RN

## 2017-08-10 ENCOUNTER — Telehealth: Payer: Self-pay

## 2017-08-10 NOTE — Telephone Encounter (Signed)
LVM for patient reminding of SCP visit with [place] on 08/18/17 at 10 am.  Left center number for callback with questions.

## 2017-08-18 ENCOUNTER — Encounter: Payer: Self-pay | Admitting: Adult Health

## 2017-08-18 ENCOUNTER — Inpatient Hospital Stay: Payer: 59 | Attending: Adult Health | Admitting: Adult Health

## 2017-08-18 VITALS — BP 130/98 | HR 66 | Temp 99.3°F | Resp 18 | Ht 64.0 in | Wt 213.6 lb

## 2017-08-18 DIAGNOSIS — Z170 Estrogen receptor positive status [ER+]: Secondary | ICD-10-CM | POA: Diagnosis not present

## 2017-08-18 DIAGNOSIS — F419 Anxiety disorder, unspecified: Secondary | ICD-10-CM | POA: Diagnosis not present

## 2017-08-18 DIAGNOSIS — N951 Menopausal and female climacteric states: Secondary | ICD-10-CM

## 2017-08-18 DIAGNOSIS — C50212 Malignant neoplasm of upper-inner quadrant of left female breast: Secondary | ICD-10-CM | POA: Diagnosis not present

## 2017-08-18 NOTE — Progress Notes (Signed)
CLINIC:  Survivorship   REASON FOR VISIT:  Routine follow-up post-treatment for a recent history of breast cancer.  BRIEF ONCOLOGIC HISTORY:    Malignant neoplasm of upper-inner quadrant of left breast in female, estrogen receptor positive ([place])   01/25/2017 Surgery    Left lumpectomy: IDC grade 1 and DCIS, 9 mmsize, margins negative, 0/2 lymph nodes negative, ER 95%, PR 100 %, HER-2 negative, Ki-67 10%T1b N0 stage IA      03/08/2017 - 04/20/2017 Radiation Therapy    Adjuvant radiation      05/2017 -  Anti-estrogen oral therapy    Tamoxifen daily       INTERVAL HISTORY:  Tasha Thomas presents to the [facility] today for our initial meeting to review her survivorship care plan detailing her treatment course for breast cancer, as well as monitoring long-term side effects of that treatment, education regarding health maintenance, screening, and overall wellness and health promotion.     Overall, Tasha Thomas reports feeling moderately well.  Her main issue that she has experienced has been anxiety and hot flashes.  Tasha Thomas has undergone a lot socially with her breast cancer diagnosis and treatment coupled with her brother's death.  It has been a lot for her to handle.  She has gone to therapy, and she most recently was placed on Celexa for her anxiety.  The Celexa is working to help her not feel so anxious, but she says she doesn't really feel anything.  She doesn't like this apathy she is experiencing.  She is also noting hot flashes since starting the tamoxifen.      REVIEW OF SYSTEMS:  Review of Systems  Constitutional: Negative for appetite change, chills, fatigue, fever and unexpected weight change.  HENT:   Negative for hearing loss, lump/mass and trouble swallowing.   Eyes: Negative for eye problems and icterus.  Respiratory: Negative for chest tightness, cough and shortness of breath.   Cardiovascular: Negative for chest pain, leg swelling and palpitations.    Gastrointestinal: Negative for abdominal distention, abdominal pain, constipation, diarrhea, nausea and vomiting.  Endocrine: Positive for hot flashes.  Musculoskeletal: Negative for arthralgias.  Skin: Negative for itching.  Neurological: Negative for dizziness, extremity weakness, headaches and numbness.  Hematological: Negative for adenopathy. Does not bruise/bleed easily.  Psychiatric/Behavioral: The patient is nervous/anxious.   Breast: Denies any new nodularity, masses, tenderness, nipple changes, or nipple discharge.      ONCOLOGY TREATMENT TEAM:  1. Surgeon:  Dr. Illa Level at [facility] 2. Medical Oncologist: Dr. Lindi Adie  3. Radiation Oncologist: Dr. Sondra Come    PAST MEDICAL/SURGICAL HISTORY:  Past Medical History:  Diagnosis Date  . Breast cancer, left ([place])   . Depression   . History of radiation therapy 03/08/17-04/23/17   left breast 50.4 Gy in 28 fractions, boost 10 Gy in 5 fractions  . Hypertension    Past Surgical History:  Procedure Laterality Date  . ABCESS DRAINAGE Left    left breast when patient was 21  . BREAST LUMPECTOMY Left 01/25/2017     ALLERGIES:  Allergies  Allergen Reactions  . Erythromycin Rash     CURRENT MEDICATIONS:  Outpatient Encounter Medications as of 08/18/2017  Medication Sig  . amLODipine (NORVASC) 5 MG tablet Take 5 mg by mouth daily.  . Biotin 5000 MCG CAPS Take by mouth daily.  . Calcium-Phosphorus-Vitamin D (CALCIUM GUMMIES PO) Take 1,000 mg by mouth daily.  . cetirizine (ZYRTEC) 10 MG tablet Take 10 mg by mouth.  . citalopram (CELEXA)  10 MG tablet   . furosemide (LASIX) 20 MG tablet Take 20 mg by mouth daily.  . Multiple Vitamin (MULTIVITAMIN) tablet Take 1 tablet by mouth daily.  Earney Navy Bicarbonate (ZEGERID OTC PO) Take 1 tablet by mouth daily.  . tamoxifen (NOLVADEX) 20 MG tablet Take 1 tablet (20 mg total) by mouth daily.   No facility-administered encounter medications on file as of  08/18/2017.      ONCOLOGIC FAMILY HISTORY:  Family History  Problem Relation Age of Onset  . Lupus Maternal Aunt   . Lung cancer Paternal Grandfather        SOCIAL HISTORY:  Social History   Socioeconomic History  . Marital status: Married    Spouse name: Not on file  . Number of children: 3  . Years of education: Not on file  . Highest education level: Not on file  Occupational History  . Occupation: Best boy: [employer]  . Financial resource strain: Not on file  . Food insecurity:    Worry: Not on file    Inability: Not on file  . Transportation needs:    Medical: Not on file    Non-medical: Not on file  Tobacco Use  . Smoking status: Never Smoker  . Smokeless tobacco: Never Used  Substance and Sexual Activity  . Alcohol use: Yes    Comment: 1x per week  . Drug use: No  . Sexual activity: Not on file  Lifestyle  . Physical activity:    Days per week: Not on file    Minutes per session: Not on file  . Stress: Not on file  Relationships  . Social connections:    Talks on phone: Not on file    Gets together: Not on file    Attends religious service: Not on file    Active member of club or organization: Not on file    Attends meetings of clubs or organizations: Not on file    Relationship status: Not on file  . Intimate partner violence:    Fear of current or ex partner: Not on file    Emotionally abused: Not on file    Physically abused: Not on file    Forced sexual activity: Not on file  Other Topics Concern  . Not on file  Social History Narrative  . Not on file      PHYSICAL EXAMINATION:  Vital Signs:   Vitals:   08/18/17 0936  BP: (!) 130/98  Pulse: 66  Resp: 18  Temp: 99.3 F (37.4 C)  SpO2: 99%   Filed Weights   08/18/17 0936  Weight: 213 lb 9.6 oz (96.9 kg)   General: Well-nourished, well-appearing female in no acute distress.  She is unaccompanied today.   HEENT: Head is normocephalic.  Pupils equal  and reactive to light. Conjunctivae clear without exudate.  Sclerae anicteric. Oral mucosa is pink, moist.  Oropharynx is pink without lesions or erythema.  Lymph: No cervical, supraclavicular, or infraclavicular lymphadenopathy noted on palpation.  Cardiovascular: Regular rate and rhythm.Marland Kitchen Respiratory: Clear to auscultation bilaterally. Chest expansion symmetric; breathing non-labored.  Breasts: left breast with hyperpigmentation from radiation, s/p lumpectomy, no nodularity, no sign of recurrence or mass in breast, right breast without nodules, masses, skin or nipple changes GI: Abdomen soft and round; non-tender, non-distended. Bowel sounds normoactive.  GU: Deferred.  Neuro: No focal deficits. Steady gait.  Psych: Mood and affect normal and appropriate for situation.  Extremities: No edema. MSK:  No focal spinal tenderness to palpation.  Full range of motion in bilateral upper extremities Skin: Warm and dry.  LABORATORY DATA:  None for this visit.  DIAGNOSTIC IMAGING:  None for this visit.      ASSESSMENT AND PLAN:  Ms.. Tasha Thomas is a pleasant 52 y.o. female with Stage IA left breast invasive ductal carcinoma, ER+/PR+/HER2-, diagnosed in 01/2017, treated with lumpectomy, adjuvant radiation therapy, and anti-estrogen therapy with Tamoxifen beginning in 05/2017.  She presents to the Survivorship Clinic for our initial meeting and routine follow-up post-completion of treatment for breast cancer.    1. Stage IA left breast cancer:  Tasha Thomas is continuing to recover from definitive treatment for breast cancer. She will follow-up with her medical oncologist, Dr. Lindi Adie in 6 months with history and physical exam per surveillance protocol.  She will continue her anti-estrogen therapy with Tamoxifen. Thus far, she is tolerating the Tamoxifen well, with minimal side effects (see #2).  Today, a comprehensive survivorship care plan and treatment summary was reviewed with the patient today detailing  her breast cancer diagnosis, treatment course, potential late/long-term effects of treatment, appropriate follow-up care with recommendations for the future, and patient education resources.  A copy of this summary, along with a letter will be sent to the patient's primary care provider via mail/fax/In Basket message after today's visit.    2. Anxiety and Hot Flashes: Tasha Thomas does not like taking Celexa.  She would like to start on Effexor, since it can help alleviate some of her hot flashes.  She will taper off of the Celexa over the next week as tolerated, and will call me once she has tapered off.  Then I will send in Effexor for her to take.  She notes she has been on Effexor many years ago and tolerated it well.    3. Bone health:  Given Ms. Thompson's history of breast cancer, she is at risk for bone demineralization.  I counseled her that Tamoxifen has a protective effect on her bones.  She was given education on specific activities to promote bone health.  4. Cancer screening:  Due to Ms. Thompson's history and her age, she should receive screening for skin cancers, colon cancer, and gynecologic cancers.  The information and recommendations are listed on the patient's comprehensive care plan/treatment summary and were reviewed in detail with the patient.    5. Health maintenance and wellness promotion: Tasha Thomas was encouraged to consume 5-7 servings of fruits and vegetables per day. We reviewed the "Nutrition Rainbow" handout, as well as the handout "Take Control of Your Health and Reduce Your Cancer Risk" from the [place].  She was also encouraged to engage in moderate to vigorous exercise for 30 minutes per day most days of the week. We discussed the [employer] fitness program, which is designed for cancer survivors to help them become more physically fit after cancer treatments.  She was instructed to limit her alcohol consumption and continue to abstain from tobacco use.       6. Support services/counseling: It is not uncommon for this period of the patient's cancer care trajectory to be one of many emotions and stressors.  We discussed an opportunity for her to participate in the next session of [facility] ("Finding Your New Normal") support group series designed for patients after they have completed treatment.   Tasha Thomas was encouraged to take advantage of our many other support services programs, support groups, and/or counseling in coping with her new life  as a cancer survivor after completing anti-cancer treatment.  She was offered support today through active listening and expressive supportive counseling.  She was given information regarding our available services and encouraged to contact me with any questions or for help enrolling in any of our support group/programs.    Dispo:   -Return to cancer center in 6 months for f/u with Dr. Lindi Adie  -Mammogram due in 10/2017 (ordered at [facility] as patient has moved to [facility]) -Follow up with surgery per Dr. Illa Level, would request that he see patient additionally in 08/2018 after her next visit. -She is welcome to return back to the Survivorship Clinic at any time; no additional follow-up needed at this time.  -Consider referral back to survivorship as a long-term survivor for continued surveillance  A total of (30) minutes of face-to-face time was spent with this patient with greater than 50% of that time in counseling and care-coordination.   Gardenia Phlegm, NP Survivorship Program [facility] [phone]   Note: PRIMARY CARE PROVIDER Ronita Hipps, [place] [phone]

## 2017-08-30 ENCOUNTER — Telehealth: Payer: Self-pay

## 2017-08-30 ENCOUNTER — Other Ambulatory Visit: Payer: Self-pay | Admitting: Adult Health

## 2017-08-30 MED ORDER — VENLAFAXINE HCL ER 37.5 MG PO CP24: 37.5000 mg | ORAL_CAPSULE | Freq: Every day | ORAL | 0 refills | Status: DC

## 2017-08-30 NOTE — Telephone Encounter (Signed)
Received teamhealth correspondence sheet regarding pt is going out of town and needs medication called in to [place] pt and she said she was supposed to call Mendel Ryder NP when she stopped her Celexa 4 days ago so she can be switched to another antidepressant that does not interfere with Tamoxifen.  Notified Lindsey NP and she placed order.  Called pt back and let her know per Mendel Ryder NP that new prescription Effexor-XR will be started at a low dose and when the month is about up, there is a possibility that the dosage may have to be increased so instructed pt to call our office.  Pt voiced understanding. No other needs per pt at this time.

## 2017-10-04 ENCOUNTER — Other Ambulatory Visit: Payer: Self-pay | Admitting: Adult Health

## 2017-10-04 MED ORDER — VENLAFAXINE HCL ER 37.5 MG PO CP24: 37.5000 mg | ORAL_CAPSULE | Freq: Every day | ORAL | 5 refills | Status: DC

## 2017-10-31 ENCOUNTER — Other Ambulatory Visit: Payer: Self-pay

## 2017-11-01 NOTE — Progress Notes (Signed)
Updated pharmacy as [facility] with patient.

## 2018-01-23 ENCOUNTER — Other Ambulatory Visit: Payer: Self-pay | Admitting: *Deleted

## 2018-01-23 MED ORDER — VENLAFAXINE HCL ER 37.5 MG PO CP24: 37.5000 mg | ORAL_CAPSULE | Freq: Every day | ORAL | 5 refills | Status: DC

## 2018-02-17 ENCOUNTER — Inpatient Hospital Stay: Payer: 59 | Attending: Hematology and Oncology | Admitting: Hematology and Oncology

## 2018-02-17 ENCOUNTER — Telehealth: Payer: Self-pay | Admitting: Hematology and Oncology

## 2018-02-17 DIAGNOSIS — N951 Menopausal and female climacteric states: Secondary | ICD-10-CM | POA: Diagnosis not present

## 2018-02-17 DIAGNOSIS — Z170 Estrogen receptor positive status [ER+]: Secondary | ICD-10-CM | POA: Insufficient documentation

## 2018-02-17 DIAGNOSIS — C50212 Malignant neoplasm of upper-inner quadrant of left female breast: Secondary | ICD-10-CM | POA: Insufficient documentation

## 2018-02-17 DIAGNOSIS — Z79899 Other long term (current) drug therapy: Secondary | ICD-10-CM | POA: Insufficient documentation

## 2018-02-17 MED ORDER — TAMOXIFEN CITRATE 20 MG PO TABS: 20.0000 mg | ORAL_TABLET | Freq: Every day | ORAL | 3 refills | Status: DC

## 2018-02-17 MED ORDER — HYDROCHLOROTHIAZIDE 25 MG PO TABS: 25.0000 mg | ORAL_TABLET | Freq: Every day | ORAL | Status: AC

## 2018-02-17 NOTE — Progress Notes (Signed)
Patient Care Team: Ronita Hipps, MD as PCP - General (Family Medicine) Delice Bison, Charlestine Massed, NP as Nurse Practitioner (Hematology and Oncology) Nicholas Lose, MD as Consulting Physician (Hematology and Oncology) Gery Pray, MD as Consulting Physician (Radiation Oncology) Marylu Lund., MD as Referring Physician (Surgery)  DIAGNOSIS:  Encounter Diagnosis  Name Primary?  . Malignant neoplasm of upper-inner quadrant of left breast in female, estrogen receptor positive ([place])     SUMMARY OF ONCOLOGIC HISTORY:   Malignant neoplasm of upper-inner quadrant of left breast in female, estrogen receptor positive ([place])   01/25/2017 Surgery    Left lumpectomy: IDC grade 1 and DCIS, 9 mmsize, margins negative, 0/2 lymph nodes negative, ER 95%, PR 100 %, HER-2 negative, Ki-67 10%T1b N0 stage IA    03/08/2017 - 04/20/2017 Radiation Therapy    Adjuvant radiation    05/2017 -  Anti-estrogen oral therapy    Tamoxifen daily     CHIEF COMPLIANT: Follow-up on tamoxifen therapy  INTERVAL HISTORY: Tasha Thomas is a 60-year with above-mentioned history of left breast cancer treated with lumpectomy radiation is currently on tamoxifen.  She is tolerating it extremely well.  She has very occasional hot flashes.  Denies any arthralgias or myalgias.  Denies any pain lumps or nodules in the breast.  REVIEW OF SYSTEMS:   Constitutional: Denies fevers, chills or abnormal weight loss Eyes: Denies blurriness of vision Ears, nose, mouth, throat, and face: Denies mucositis or sore throat Respiratory: Denies cough, dyspnea or wheezes Cardiovascular: Denies palpitation, chest discomfort Gastrointestinal:  Denies nausea, heartburn or change in bowel habits Skin: Denies abnormal skin rashes Lymphatics: Denies new lymphadenopathy or easy bruising Neurological:Denies numbness, tingling or new weaknesses Behavioral/Psych: Mood is stable, no new changes  Extremities: No lower extremity  edema Breast:  denies any pain or lumps or nodules in either breasts All other systems were reviewed with the patient and are negative.  I have reviewed the past medical history, past surgical history, social history and family history with the patient and they are unchanged from previous note.  ALLERGIES:  is allergic to erythromycin.  MEDICATIONS:  Current Outpatient Medications  Medication Sig Dispense Refill  . amLODipine (NORVASC) 5 MG tablet Take 5 mg by mouth daily.    . Biotin 5000 MCG CAPS Take by mouth daily.    . Calcium-Phosphorus-Vitamin D (CALCIUM GUMMIES PO) Take 1,000 mg by mouth daily.    . cetirizine (ZYRTEC) 10 MG tablet Take 10 mg by mouth.    . hydrochlorothiazide (HYDRODIURIL) 25 MG tablet Take 1 tablet (25 mg total) by mouth daily.    . Multiple Vitamin (MULTIVITAMIN) tablet Take 1 tablet by mouth daily.    Earney Navy Bicarbonate (ZEGERID OTC PO) Take 1 tablet by mouth daily.    . tamoxifen (NOLVADEX) 20 MG tablet Take 1 tablet (20 mg total) by mouth daily. 90 tablet 3  . venlafaxine XR (EFFEXOR-XR) 37.5 MG 24 hr capsule Take 1 capsule (37.5 mg total) by mouth daily with breakfast. 30 capsule 5   No current facility-administered medications for this visit.     PHYSICAL EXAMINATION: ECOG PERFORMANCE STATUS: 1 - Symptomatic but completely ambulatory  Vitals:   02/17/18 1116  BP: (!) 131/96  Pulse: 85  Resp: 20  Temp: 98.7 F (37.1 C)  SpO2: 98%   Filed Weights   02/17/18 1116  Weight: 208 lb (94.3 kg)    GENERAL:alert, no distress and comfortable SKIN: skin color, texture, turgor are normal, no rashes or significant lesions  EYES: normal, Conjunctiva are pink and non-injected, sclera clear OROPHARYNX:no exudate, no erythema and lips, buccal mucosa, and tongue normal  NECK: supple, thyroid normal size, non-tender, without nodularity LYMPH:  no palpable lymphadenopathy in the cervical, axillary or inguinal LUNGS: clear to auscultation and  percussion with normal breathing effort HEART: regular rate & rhythm and no murmurs and no lower extremity edema ABDOMEN:abdomen soft, non-tender and normal bowel sounds MUSCULOSKELETAL:no cyanosis of digits and no clubbing  NEURO: alert & oriented x 3 with fluent speech, no focal motor/sensory deficits EXTREMITIES: No lower extremity edema BREAST: No palpable masses or nodules in either right or left breasts. No palpable axillary supraclavicular or infraclavicular adenopathy no breast tenderness or nipple discharge. (exam performed in the presence of a chaperone)   ASSESSMENT & PLAN:  Malignant neoplasm of upper-inner quadrant of left breast in female, estrogen receptor positive ([place]) 01/26/17: Left lumpectomy: IDC grade 1 and DCIS, 9 mm size, margins negative, 0/2 lymph nodes negative, ER 95%, PR 100 %, HER-2 negative, Ki-67 10%T1b N0 stage IA  Treatment summary: 1. Adjuvant radiation therapy 03/08/2017-04/20/2017 2. Adjuvant antiestrogen therapy with tamoxifen 20 mg daily 10 years versus switching her after she becomes menopausal to anastrozole.  Started January 2019  Tamoxifen toxicities: There is mild hot flashes. She has not had menstrual cycle in the last 2 months.  Next year when she comes back we may consider switching her from tamoxifen to aromatase inhibitor therapy.  Ms.  Breast cancer surveillance: 1.  Breast exam 02/17/2018 2. mammograms will need to be scheduled    No orders of the defined types were placed in this encounter.  The patient has a good understanding of the overall plan. she agrees with it. she will call with any problems that may develop before the next visit here.   Harriette Ohara, MD 02/17/18

## 2018-02-17 NOTE — Assessment & Plan Note (Signed)
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 Adjuvant radiation therapy 03/08/2017-04/20/2017 2. Adjuvant antiestrogen therapy with tamoxifen 20 mg daily 10 years versus switching her after she becomes menopausal to anastrozole.  Started January 2019  Tamoxifen toxicities:  Breast cancer surveillance: 1.  Breast exam 02/17/2018 2. mammograms will need to be scheduled

## 2018-02-17 NOTE — Telephone Encounter (Signed)
Mailed calendar to pt

## 2018-06-27 ENCOUNTER — Encounter: Payer: Self-pay | Admitting: Gastroenterology

## 2018-07-20 ENCOUNTER — Telehealth: Payer: Self-pay | Admitting: Hematology and Oncology

## 2018-07-20 NOTE — Telephone Encounter (Signed)
Per sch msg, patient needs to reschedule appt. Called patient, left msg to call back

## 2018-07-21 ENCOUNTER — Ambulatory Visit: Payer: 59 | Admitting: Gastroenterology

## 2018-07-21 ENCOUNTER — Ambulatory Visit (INDEPENDENT_AMBULATORY_CARE_PROVIDER_SITE_OTHER): Payer: 59

## 2018-07-21 ENCOUNTER — Encounter: Payer: Self-pay | Admitting: Gastroenterology

## 2018-07-21 ENCOUNTER — Other Ambulatory Visit: Payer: Self-pay | Admitting: Podiatry

## 2018-07-21 ENCOUNTER — Other Ambulatory Visit: Payer: Self-pay

## 2018-07-21 ENCOUNTER — Ambulatory Visit: Payer: Self-pay | Admitting: Podiatry

## 2018-07-21 VITALS — BP 126/88 | HR 81 | Temp 98.8°F | Ht 64.0 in | Wt 214.5 lb

## 2018-07-21 DIAGNOSIS — M779 Enthesopathy, unspecified: Secondary | ICD-10-CM

## 2018-07-21 DIAGNOSIS — M79674 Pain in right toe(s): Secondary | ICD-10-CM

## 2018-07-21 DIAGNOSIS — K219 Gastro-esophageal reflux disease without esophagitis: Secondary | ICD-10-CM | POA: Diagnosis not present

## 2018-07-21 DIAGNOSIS — M722 Plantar fascial fibromatosis: Secondary | ICD-10-CM

## 2018-07-21 DIAGNOSIS — M2041 Other hammer toe(s) (acquired), right foot: Secondary | ICD-10-CM | POA: Diagnosis not present

## 2018-07-21 DIAGNOSIS — Z1211 Encounter for screening for malignant neoplasm of colon: Secondary | ICD-10-CM | POA: Diagnosis not present

## 2018-07-21 DIAGNOSIS — Z8371 Family history of colonic polyps: Secondary | ICD-10-CM

## 2018-07-21 DIAGNOSIS — M79675 Pain in left toe(s): Secondary | ICD-10-CM

## 2018-07-21 DIAGNOSIS — M79672 Pain in left foot: Secondary | ICD-10-CM

## 2018-07-21 DIAGNOSIS — M79671 Pain in right foot: Secondary | ICD-10-CM

## 2018-07-21 MED ORDER — SUPREP BOWEL PREP KIT 17.5-3.13-1.6 GM/177ML PO SOLN: 1.0000 | ORAL | 0 refills | Status: DC

## 2018-07-21 MED ORDER — FAMOTIDINE 20 MG PO TABS: 20.0000 mg | ORAL_TABLET | Freq: Every day | ORAL | 11 refills | Status: DC

## 2018-07-21 MED ORDER — PANTOPRAZOLE SODIUM 40 MG PO TBEC: 40.0000 mg | DELAYED_RELEASE_TABLET | Freq: Every day | ORAL | 11 refills | Status: DC

## 2018-07-21 NOTE — Patient Instructions (Signed)
If you are age 51 or older, your body mass index should be between 23-30. Your Body mass index is 36.82 kg/m. If this is out of the aforementioned range listed, please consider follow up with your Primary Care Provider.  If you are age 20 or younger, your body mass index should be between 19-25. Your Body mass index is 36.82 kg/m. If this is out of the aformentioned range listed, please consider follow up with your Primary Care Provider.   We have sent the following medications to your pharmacy for you to pick up at your convenience: Altha have been scheduled for an endoscopy and colonoscopy. Please follow the written instructions given to you at your visit today. Please pick up your prep supplies at the pharmacy within the next 1-3 days. If you use inhalers (even only as needed), please bring them with you on the day of your procedure. Your physician has requested that you go to www.startemmi.com and enter the access code given to you at your visit today. This web site gives a general overview about your procedure. However, you should still follow specific instructions given to you by our office regarding your preparation for the procedure.   Thank you,  Dr. Jackquline Denmark

## 2018-07-21 NOTE — Progress Notes (Signed)
Chief Complaint:   Referring Provider:  Ronita Hipps, MD      ASSESSMENT AND PLAN;   #1. 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 #2. Colorectal cancer screening #3. FH colon polyps (son at age 51)  Plan: - Proceed with EGD/colon. Discussed risks & benefits. (Risks including rare perforation req laparotomy, bleeding after biopsies/polypectomy req blood transfusion, rare chance of missing neoplasms, risks of anesthesia/sedation). Benefits outweigh the risks. Patient agrees to proceed. All the questions were answered. Consent forms given for review. -Changed omeprazole to Protonix 40mg  po qd.  -pepcid 20mg  po qhs. -If still with problems, consider ENT evaluation. -If with any further abdominal pain, proceed with CT scan Abdo/pelvis.    HPI:    Tasha Thomas is a 53 y.o. female  With longstanding history of gastroesophageal reflux associated with heartburn and coughing.  No odynophagia or dysphagia.  She has used omeprazole without any significant relief.  Does cough and clear her throat constantly, especially after eating.  Has not been sleeping very well because of reflux.  At times he has to sleep on a recliner.  Has previously been evaluated by pulmonary, allergy/immunology.  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  Had left upper quadrant abdominal pain which is resolved.  She denies having any significant diarrhea or constipation.  No melena or hematochezia.  Also due for colorectal cancer screening.   SH-has situational anxiety, husband had motorbike accident requiring 150 stitches, patient diagnosed with early stage breast cancer (Last year), also had suicide in the family Past Medical History:  Diagnosis Date  . Breast cancer, left ([place]) 02/27/2017  . Depression   . GERD (gastroesophageal reflux disease)   . History of radiation therapy 03/08/17-04/23/17   left breast 50.4 Gy in 28 fractions, boost 10 Gy in 5 fractions   . Hot flashes   . Hypertension   . Obesity     Past Surgical History:  Procedure Laterality Date  . ABCESS DRAINAGE Left    left breast when patient was 21  . BREAST LUMPECTOMY Left 01/25/2017   2 seperate spots  . ESOPHAGOGASTRODUODENOSCOPY     2001 in [place]    Family History  Problem Relation Age of Onset  . Lupus Maternal Aunt   . Lung cancer Paternal Grandfather   . Diabetes Brother   . Colon polyps Son   . Colon cancer Neg Hx   . Esophageal cancer Neg Hx     Social History   Tobacco Use  . Smoking status: Never Smoker  . Smokeless tobacco: Never Used  Substance Use Topics  . Alcohol use: Yes    Comment: 1x per week, socially  . Drug use: No    Current Outpatient Medications  Medication Sig Dispense Refill  . amLODipine (NORVASC) 5 MG tablet Take 5 mg by mouth daily.    . Biotin 5000 MCG CAPS Take by mouth daily.    . Calcium-Phosphorus-Vitamin D (CALCIUM GUMMIES PO) Take 1,000 mg by mouth daily.    . cetirizine (ZYRTEC) 10 MG tablet Take 10 mg by mouth.    . hydrochlorothiazide (HYDRODIURIL) 25 MG tablet Take 1 tablet (25 mg total) by mouth daily.    . Multiple Vitamin (MULTIVITAMIN) tablet Take 1 tablet by mouth daily.    [place] Bicarbonate (ZEGERID OTC PO) Take 1 tablet by mouth daily.    . tamoxifen (NOLVADEX) 20 MG tablet Take 1 tablet (20 mg total)  by mouth daily. 90 tablet 3  . Citalopram Hydrobromide (CELEXA PO) Take 1 tablet by mouth daily.     No current facility-administered medications for this visit.     Allergies  Allergen Reactions  . Erythromycin Rash    Review of Systems:  Constitutional: Denies fever, chills, diaphoresis, appetite change and fatigue.  HEENT: Denies photophobia, eye pain, redness, hearing loss, ear pain, congestion, sore throat, rhinorrhea, sneezing, mouth sores, neck pain, neck stiffness and tinnitus.   Respiratory: Denies SOB, DOE, cough, chest tightness,  and wheezing.   Cardiovascular: Denies chest  pain, palpitations and leg swelling.  Genitourinary: Denies dysuria, urgency, frequency, hematuria, flank pain and difficulty urinating.  Musculoskeletal: Denies myalgias, back pain, joint swelling, arthralgias and gait problem.  Skin: No rash.  Neurological: Denies dizziness, seizures, syncope, weakness, light-headedness, numbness and headaches.  Hematological: Denies adenopathy. Easy bruising, personal or family bleeding history  Psychiatric/Behavioral: Has situational anxiety     Physical Exam:    BP 126/88   Pulse 81   Temp 98.8 F (37.1 C)   Ht 5\' 4"  (1.626 m)   Wt 214 lb 8 oz (97.3 kg)   BMI 36.82 kg/m  Filed Weights   07/21/18 1352  Weight: 214 lb 8 oz (97.3 kg)   Constitutional:  Well-developed, in no acute distress. Psychiatric: Normal mood and affect. Behavior is normal. HEENT: Pupils normal.  Conjunctivae are normal. No scleral icterus. Neck supple.  Cardiovascular: Normal rate, regular rhythm. No edema Pulmonary/chest: Effort normal and breath sounds normal. No wheezing, rales or rhonchi. Abdominal: Soft, nondistended. Nontender. Bowel sounds active throughout. There are no masses palpable. No hepatomegaly. Rectal:  defered Neurological: Alert and oriented to person place and time. Skin: Skin is warm and dry. No rashes noted.      Carmell Austria, MD 07/21/2018, 2:07 PM  Cc: Ronita Hipps, MD

## 2018-07-24 ENCOUNTER — Telehealth: Payer: Self-pay | Admitting: Podiatry

## 2018-07-24 ENCOUNTER — Telehealth: Payer: Self-pay | Admitting: Hematology and Oncology

## 2018-07-24 NOTE — Telephone Encounter (Signed)
I informed pt Dr. Jacqualyn Posey liked SuperFeet at [employer].

## 2018-07-24 NOTE — Telephone Encounter (Signed)
Patient called to reschedule 10/16 f/u to 10/19. Patient has new date/time.

## 2018-07-24 NOTE — Telephone Encounter (Signed)
Superfeet

## 2018-07-24 NOTE — Telephone Encounter (Signed)
Pt was recommended a type of arch support to use by Dr. Jacqualyn Posey and she doesn't remember the name.

## 2018-07-30 NOTE — Progress Notes (Signed)
Subjective:   Patient ID: Tasha Thomas, female   DOB: 53 y.o.   MRN: [id number]   HPI 53 year old female presents the office today for concerns of bilateral foot issues.  She states that on her right foot the third and fourth toes are starting to separate as well as also started on the left side.  She has discomfort to the medial aspect of her left foot as well when she flexes her foot or moves into the medial, lateral side.  It feels better when wearing better supportive shoes.  She denies any recent injury or trauma.  She does have history of plantar fasciitis.  No swelling.  No numbness or tingling.  No burning to the toes.   Review of Systems  All other systems reviewed and are negative.  Past Medical History:  Diagnosis Date  . Breast cancer, left ([facility]) 02/27/2017  . Depression   . GERD (gastroesophageal reflux disease)   . History of radiation therapy 03/08/17-04/23/17   left breast 50.4 Gy in 28 fractions, boost 10 Gy in 5 fractions  . Hot flashes   . Hypertension   . Obesity     Past Surgical History:  Procedure Laterality Date  . ABCESS DRAINAGE Left    left breast when patient was 21  . BREAST LUMPECTOMY Left 01/25/2017   2 seperate spots  . ESOPHAGOGASTRODUODENOSCOPY     2001 in [place]     Current Outpatient Medications:  .  amLODipine (NORVASC) 5 MG tablet, Take 5 mg by mouth daily., Disp: , Rfl:  .  Biotin 5000 MCG CAPS, Take by mouth daily., Disp: , Rfl:  .  Calcium-Phosphorus-Vitamin D (CALCIUM GUMMIES PO), Take 1,000 mg by mouth daily., Disp: , Rfl:  .  cetirizine (ZYRTEC) 10 MG tablet, Take 10 mg by mouth., Disp: , Rfl:  .  hydrochlorothiazide (HYDRODIURIL) 25 MG tablet, Take 1 tablet (25 mg total) by mouth daily., Disp: , Rfl:  .  Multiple Vitamin (MULTIVITAMIN) tablet, Take 1 tablet by mouth daily., Disp: , Rfl:  .  Omeprazole-Sodium Bicarbonate (ZEGERID OTC PO), Take 1 tablet by mouth daily., Disp: , Rfl:  .  tamoxifen (NOLVADEX) 20 MG tablet, Take 1  tablet (20 mg total) by mouth daily., Disp: 90 tablet, Rfl: 3 .  Citalopram Hydrobromide (CELEXA PO), Take 1 tablet by mouth daily., Disp: , Rfl:  .  famotidine (PEPCID) 20 MG tablet, Take 1 tablet (20 mg total) by mouth at bedtime., Disp: 30 tablet, Rfl: 11 .  pantoprazole (PROTONIX) 40 MG tablet, Take 1 tablet (40 mg total) by mouth daily., Disp: 30 tablet, Rfl: 11 .  SUPREP BOWEL PREP KIT 17.5-3.13-1.6 GM/177ML SOLN, Take 1 kit by mouth as directed., Disp: 354 mL, Rfl: 0  Allergies  Allergen Reactions  . Erythromycin Rash    Social History   Socioeconomic History  . Marital status: Married    Spouse name: Not on file  . Number of children: 3  . Years of education: Not on file  . Highest education level: Not on file  Occupational History  . Occupation: Best boy: [employer]  . Financial resource strain: Not on file  . Food insecurity:    Worry: Not on file    Inability: Not on file  . Transportation needs:    Medical: Not on file    Non-medical: Not on file  Tobacco Use  . Smoking status: Never Smoker  . Smokeless tobacco: Never Used  Substance and  Sexual Activity  . Alcohol use: Yes    Thomas: 1x per week, socially  . Drug use: No  . Sexual activity: Not on file  Lifestyle  . Physical activity:    Days per week: Not on file    Minutes per session: Not on file  . Stress: Not on file  Relationships  . Social connections:    Talks on phone: Not on file    Gets together: Not on file    Attends religious service: Not on file    Active member of club or organization: Not on file    Attends meetings of clubs or organizations: Not on file    Relationship status: Not on file  . Intimate partner violence:    Fear of current or ex partner: Not on file    Emotionally abused: Not on file    Physically abused: Not on file    Forced sexual activity: Not on file  Other Topics Concern  . Not on file  Social History Narrative  . Not on file         Objective:  Physical Exam  General: AAO x3, NAD  Dermatological: Skin is warm, dry and supple bilateral. Nails x 10 are well manicured; remaining integument appears unremarkable at this time. There are no open sores, no preulcerative lesions, no rash or signs of infection present.  Vascular: Dorsalis Pedis artery and Posterior Tibial artery pedal pulses are 2/4 bilateral with immedate capillary fill time. Pedal hair growth present. No varicosities and no lower extremity edema present bilateral. There is no pain with calf compression, swelling, warmth, erythema.   Neruologic: Grossly intact via light touch bilateral.  Protective threshold with Semmes Wienstein monofilament intact to all pedal sites bilateral.  Negative Tinel sign  Musculoskeletal: This time there is no area pinpoint tenderness pain vibratory sensation.  There is no edema, erythema.  On weightbearing her third and fourth toes are separating with the right side worse than left.  There is no palpable neuroma.  On the medial aspect of her left foot along the course of the flexor tendon she does get some subjective discomfort but not able to elicit any tenderness today.  There is no pain on the course or insertion on her fascia, Achilles tendon.  Gait: Unassisted, Nonantalgic.       Assessment:   Bilateral foot pain, tendinitis    Plan:  -Treatment options discussed including all alternatives, risks, and complications -Etiology of symptoms were discussed -X-rays were obtained and reviewed with the patient.  There is no evidence of acute fracture or stress fracture.  Heel spurs are present.  The third and fourth toes are separating. -Her foot feels better when she wears better supportive shoes.  I do think that we need to work more on this.  Discussed with her orthotics and start with looking on over-the-counter insert, super feet insert.  She can use anti-inflammatories as needed.  Discussed general stretching exercise  until prevent reoccurrence of plantar fasciitis and also help with the tendinitis in her foot, ankle as well.  Trula Slade DPM

## 2018-08-25 ENCOUNTER — Encounter: Payer: 59 | Admitting: Gastroenterology

## 2018-09-12 ENCOUNTER — Telehealth: Payer: Self-pay | Admitting: *Deleted

## 2018-09-12 NOTE — Telephone Encounter (Signed)
New Suprep instructions sent via MyChart

## 2018-09-12 NOTE — Telephone Encounter (Signed)
Called pt to reschedule postponed procedures.  Reschedule her for 5/11 @830 .  Will send new prep instructions via My Chart.

## 2018-09-15 ENCOUNTER — Telehealth: Payer: Self-pay | Admitting: *Deleted

## 2018-09-15 NOTE — Telephone Encounter (Signed)
LMOM to confirm pt's appointment on Monday and to go over Covid screening questions.  Will call back later.

## 2018-09-15 NOTE — Telephone Encounter (Signed)
Covid-19 travel screening questions  Have you traveled in the last 14 days? no If yes where?  Do you now or have you had a fever in the last 14 days? no  Do you have any respiratory symptoms of shortness of breath or cough now or in the last 14 days? no Do you have any family members or close contacts with diagnosed or suspected Covid-19? No  Pt is aware that care partner will be waiting in car during procedure.  She will wear a mask into building

## 2018-09-18 ENCOUNTER — Other Ambulatory Visit: Payer: Self-pay

## 2018-09-18 ENCOUNTER — Encounter: Payer: Self-pay | Admitting: Gastroenterology

## 2018-09-18 ENCOUNTER — Ambulatory Visit (AMBULATORY_SURGERY_CENTER): Payer: 59 | Admitting: Gastroenterology

## 2018-09-18 VITALS — BP 139/84 | HR 83 | Temp 98.5°F | Resp 16 | Ht 64.0 in | Wt 214.0 lb

## 2018-09-18 DIAGNOSIS — D124 Benign neoplasm of descending colon: Secondary | ICD-10-CM

## 2018-09-18 DIAGNOSIS — K2970 Gastritis, unspecified, without bleeding: Secondary | ICD-10-CM

## 2018-09-18 DIAGNOSIS — K317 Polyp of stomach and duodenum: Secondary | ICD-10-CM

## 2018-09-18 DIAGNOSIS — K219 Gastro-esophageal reflux disease without esophagitis: Secondary | ICD-10-CM | POA: Diagnosis not present

## 2018-09-18 DIAGNOSIS — D123 Benign neoplasm of transverse colon: Secondary | ICD-10-CM | POA: Diagnosis not present

## 2018-09-18 DIAGNOSIS — K635 Polyp of colon: Secondary | ICD-10-CM | POA: Diagnosis not present

## 2018-09-18 DIAGNOSIS — Z1211 Encounter for screening for malignant neoplasm of colon: Secondary | ICD-10-CM | POA: Diagnosis present

## 2018-09-18 MED ORDER — SODIUM CHLORIDE 0.9 % IV SOLN: 500.0000 mL | Freq: Once | INTRAVENOUS | Status: DC

## 2018-09-18 NOTE — Progress Notes (Signed)
Report to PACU, RN, vss, BBS= Clear.  

## 2018-09-18 NOTE — Op Note (Addendum)
[facility] Patient Name: Tasha Thomas Procedure Date: 09/18/2018 8:52 AM MRN: [id number] Endoscopist: Jackquline Denmark , MD Age: 53 Referring MD:  Date of Birth: 11-10-1965 Gender: Female Account #: [id number] Procedure:                Colonoscopy Indications:              Screening for colorectal malignant neoplasm Medicines:                Monitored Anesthesia Care Procedure:                Pre-Anesthesia Assessment:                           - Prior to the procedure, a History and Physical                            was performed, and patient medications and                            allergies were reviewed. The patient's tolerance of                            previous anesthesia was also reviewed. The risks                            and benefits of the procedure and the sedation                            options and risks were discussed with the patient.                            All questions were answered, and informed consent                            was obtained. Prior Anticoagulants: The patient has                            taken no previous anticoagulant or antiplatelet                            agents. ASA Grade Assessment: I - A normal, healthy                            patient. After reviewing the risks and benefits,                            the patient was deemed in satisfactory condition to                            undergo the procedure.                           After obtaining informed consent, the colonoscope  was passed under direct vision. Throughout the                            procedure, the patient's blood pressure, pulse, and                            oxygen saturations were monitored continuously. The                            Model PCF-H190DL [id number]) scope was introduced                            through the anus and advanced to the 2 cm into the                            ileum. The colonoscopy was  performed without                            difficulty. The patient tolerated the procedure                            well. The quality of the bowel preparation was                            excellent. The terminal ileum, ileocecal valve,                            appendiceal orifice, and rectum were photographed.                            The colon was highly redundant. Scope In: 9:34:49 AM Scope Out: 9:52:03 AM Scope Withdrawal Time: 0 hours 10 minutes 20 seconds  Total Procedure Duration: 0 hours 17 minutes 14 seconds  Findings:                 A 4 mm polyp was found in the proximal transverse                            colon. The polyp was sessile. The polyp was removed                            with a cold biopsy forceps. Resection and retrieval                            were complete. Estimated blood loss: none.                           A 6 mm polyp was found in the mid descending colon.                            The polyp was sessile. The polyp was removed with a  cold snare. Resection and retrieval were complete.                            Estimated blood loss: none.                           Non-bleeding internal hemorrhoids were found during                            retroflexion. The hemorrhoids were small.                           The exam was otherwise without abnormality on                            direct and retroflexion views. The colon was highly                            redundant.                           The terminal ileum appeared normal. Complications:            No immediate complications. Estimated Blood Loss:     Estimated blood loss: none. Impression:               - Small colonic polyps status post polypectomy.                           - Small internal hemorrhoids.                           - Otherwise normal colonoscopy to TI. Recommendation:           - Patient has a contact number available for                             emergencies. The signs and symptoms of potential                            delayed complications were discussed with the                            patient. Return to normal activities tomorrow.                            Written discharge instructions were provided to the                            patient.                           - Resume previous diet.                           - Continue present medications.                           -  Await pathology results.                           - Repeat colonoscopy for surveillance based on                            pathology results.                           - Return to GI office PRN. Jackquline Denmark, MD 09/18/2018 9:57:14 AM This report has been signed electronically.

## 2018-09-18 NOTE — Patient Instructions (Signed)
HANDOUTS GIVEN FOR GASTRITIS, HEMORRHOIDS, AND POLYPS. NO NSAIDS FOR 5 DAYS. (ASPIRIN, IBUPROFEN,NAPROXEN OR OTHER NON-STEROIDAL ANTIINFLAMMATORY AGENTS.    YOU HAD AN ENDOSCOPIC PROCEDURE TODAY AT [facility]:   Refer to the procedure report that was given to you for any specific questions about what was found during the examination.  If the procedure report does not answer your questions, please call your gastroenterologist to clarify.  If you requested that your care partner not be given the details of your procedure findings, then the procedure report has been included in a sealed envelope for you to review at your convenience later.  YOU SHOULD EXPECT: Some feelings of bloating in the abdomen. Passage of more gas than usual.  Walking can help get rid of the air that was put into your GI tract during the procedure and reduce the bloating. If you had a lower endoscopy (such as a colonoscopy or flexible sigmoidoscopy) you may notice spotting of blood in your stool or on the toilet paper. If you underwent a bowel prep for your procedure, you may not have a normal bowel movement for a few days.  Please Note:  You might notice some irritation and congestion in your nose or some drainage.  This is from the oxygen used during your procedure.  There is no need for concern and it should clear up in a day or so.  SYMPTOMS TO REPORT IMMEDIATELY:   Following lower endoscopy (colonoscopy or flexible sigmoidoscopy):  Excessive amounts of blood in the stool  Significant tenderness or worsening of abdominal pains  Swelling of the abdomen that is new, acute  Fever of 100F or higher   Following upper endoscopy (EGD)  Vomiting of blood or coffee ground material  New chest pain or pain under the shoulder blades  Painful or persistently difficult swallowing  New shortness of breath  Fever of 100F or higher  Black, tarry-looking stools  For urgent or emergent issues, a  gastroenterologist can be reached at any hour by calling [phone].   DIET:  We do recommend a small meal at first, but then you may proceed to your regular diet.  Drink plenty of fluids but you should avoid alcoholic beverages for 24 hours.  ACTIVITY:  You should plan to take it easy for the rest of today and you should NOT DRIVE or use heavy machinery until tomorrow (because of the sedation medicines used during the test).    FOLLOW UP: Our staff will call the number listed on your records the next business day following your procedure to check on you and address any questions or concerns that you may have regarding the information given to you following your procedure. If we do not reach you, we will leave a message.  However, if you are feeling well and you are not experiencing any problems, there is no need to return our call.  We will assume that you have returned to your regular daily activities without incident. We will be calling you two weeks following your procedure to see if you have developed any symptoms of the COVID-19.  If you develop any symptoms before then, please let us know.  If any biopsies were taken you will be contacted by phone or by letter within the next 1-3 weeks.  Please call us at [phone] if you have not heard about the biopsies in 3 weeks.    SIGNATURES/CONFIDENTIALITY: You and/or your care partner have signed paperwork which will be entered into  your electronic medical record.  These signatures attest to the fact that that the information above on your After Visit Summary has been reviewed and is understood.  Full responsibility of the confidentiality of this discharge information lies with you and/or your care-partner.

## 2018-09-18 NOTE — Op Note (Addendum)
[place] Patient Name: Tasha Thomas Procedure Date: 09/18/2018 8:52 AM MRN: [id number] Endoscopist: Jackquline Denmark , MD Age: 53 Referring MD:  Date of Birth: Oct 16, 1965 Gender: Female Account #: [id number] Procedure:                Upper GI endoscopy Indications:              Heartburn, LPR Medicines:                Monitored Anesthesia Care Procedure:                Pre-Anesthesia Assessment:                           - Prior to the procedure, a History and Physical                            was performed, and patient medications and                            allergies were reviewed. The patient's tolerance of                            previous anesthesia was also reviewed. The risks                            and benefits of the procedure and the sedation                            options and risks were discussed with the patient.                            All questions were answered, and informed consent                            was obtained. Prior Anticoagulants: The patient has                            taken no previous anticoagulant or antiplatelet                            agents. ASA Grade Assessment: I - A normal, healthy                            patient. After reviewing the risks and benefits,                            the patient was deemed in satisfactory condition to                            undergo the procedure.                           After obtaining informed consent, the endoscope was  passed under direct vision. Throughout the                            procedure, the patient's blood pressure, pulse, and                            oxygen saturations were monitored continuously. The                            Endoscope was introduced through the mouth, and                            advanced to the second part of duodenum. Scope In: Scope Out: Findings:                 The lower third of the esophagus was  mildly                            tortuous. Biopsies were obtained from the proximal                            and distal esophagus with cold forceps for                            histology to r/o eosinophilic esophagitis. Z line                            was well-defined at 38 cm. Lower end of the                            esophagus was also examined by narrowband imaging.                           A single 25 mm pedunculated polyp with no bleeding                            and no stigmata of recent bleeding was found in the                            proximal gastric body, best visualized on the                            retroflexed examination. The polyp was removed with                            a hot snare. Resection and retrieval were complete.                            Estimated blood loss: none. Post polypectomy                            photodocumentation was obtained.  A single 6 mm sessile polyp with no bleeding and no                            stigmata of recent bleeding was found in the                            gastric body. The polyp was removed with a cold                            biopsy forceps. Resection and retrieval were                            complete. Estimated blood loss: none.                           Localized mild inflammation characterized by                            erythema was found in the gastric antrum. Biopsies                            were taken with a cold forceps for histology.                            Estimated blood loss: none.                           The examined duodenum was normal. Complications:            No immediate complications. Estimated Blood Loss:     Estimated blood loss: none. Impression:               -Mildly tortuous esophagus.                           -Large gastric polyp status post polypectomy.                           -Small gastric polyp status post polypectomy.                            -Mild gastritis. Recommendation:           - Patient has a contact number available for                            emergencies. The signs and symptoms of potential                            delayed complications were discussed with the                            patient. Return to normal activities tomorrow.                            Written discharge instructions were provided to the  patient.                           - Resume previous diet.                           - Continue present medications.                           - Await pathology results.                           - No aspirin, ibuprofen, naproxen, or other                            non-steroidal anti-inflammatory drugs for 5 days                            after polyp removal. Jackquline Denmark, MD 09/18/2018 10:03:33 AM This report has been signed electronically.

## 2018-09-18 NOTE — Progress Notes (Signed)
Called to room to assist during endoscopic procedure.  Patient ID and intended procedure confirmed with present staff. Received instructions for my participation in the procedure from the performing physician.  

## 2018-09-19 ENCOUNTER — Telehealth: Payer: Self-pay | Admitting: *Deleted

## 2018-09-19 NOTE — Telephone Encounter (Signed)
  Follow up Call-  Call back number 09/18/2018  Post procedure Call Back phone  # [phone]  Permission to leave phone message Yes  Some recent data might be hidden     Patient questions:  Do you have a fever, pain , or abdominal swelling? No. Pain Score  0 *  Have you tolerated food without any problems? Yes.    Have you been able to return to your normal activities? Yes.    Do you have any questions about your discharge instructions: Diet   No. Medications  No. Follow up visit  No.  Do you have questions or concerns about your Care? No.  Actions: * If pain score is 4 or above: No action needed, pain <4.

## 2018-09-21 ENCOUNTER — Telehealth: Payer: Self-pay | Admitting: *Deleted

## 2018-09-21 NOTE — Telephone Encounter (Signed)
1. Have you developed a fever since your procedure? no  2.   Have you had an respiratory symptoms (SOB or cough) since your procedure? no  3.   Have you tested positive for COVID 19 since your procedure no  3.   Have you had any family members/close contacts diagnosed with the COVID 19 since your procedure?  no   If any of these questions are a yes, please inquire if patient has been seen by family doctor and route this note to Tracy Walton, RN.  

## 2018-10-12 ENCOUNTER — Encounter: Payer: Self-pay | Admitting: Gastroenterology

## 2018-10-12 ENCOUNTER — Telehealth: Payer: Self-pay | Admitting: Neurology

## 2018-10-12 NOTE — Telephone Encounter (Signed)
Called, LVM for pt to call office before VV so I can update med list, pharmacy, allergy list, health history.

## 2018-10-12 NOTE — Telephone Encounter (Signed)
Pt gave consent for video visit.  Pt understands that although there may be some limitations with this type of visit, we will take all precautions to reduce any security or privacy concerns.  Pt understands that this will be treated like an in office visit and we will file with pt's insurance, and there may be a patient responsible charge related to this service. °

## 2018-10-16 NOTE — Telephone Encounter (Signed)
Citalopram Hydrobromide (CELEXA PO) no longer taking  Omeprazole-Sodium Bicarbonate (ZEGERID OTC PO) no longer taking  SUPREP BOWEL PREP KIT 17.5-3.13-1.6 GM/177ML SOLN no longer taking  pts pharmacy is updated , no changes in health history or allergies , allergic to erythromycin

## 2018-10-16 NOTE — Telephone Encounter (Signed)
Updated pt med list

## 2018-10-16 NOTE — Addendum Note (Signed)
Addended by: Hope Pigeon on: 10/16/2018 10:36 AM   Modules accepted: Orders

## 2018-10-17 ENCOUNTER — Encounter: Payer: Self-pay | Admitting: Neurology

## 2018-10-17 ENCOUNTER — Other Ambulatory Visit: Payer: Self-pay

## 2018-10-17 ENCOUNTER — Ambulatory Visit (INDEPENDENT_AMBULATORY_CARE_PROVIDER_SITE_OTHER): Payer: 59 | Admitting: Neurology

## 2018-10-17 DIAGNOSIS — R208 Other disturbances of skin sensation: Secondary | ICD-10-CM | POA: Diagnosis not present

## 2018-10-17 DIAGNOSIS — Z170 Estrogen receptor positive status [ER+]: Secondary | ICD-10-CM

## 2018-10-17 DIAGNOSIS — C50212 Malignant neoplasm of upper-inner quadrant of left female breast: Secondary | ICD-10-CM

## 2018-10-17 DIAGNOSIS — R200 Anesthesia of skin: Secondary | ICD-10-CM | POA: Insufficient documentation

## 2018-10-17 NOTE — Progress Notes (Signed)
[facility]  PATIENT: Tasha Thomas DOB: 1965-07-28  REFERRING DOCTOR OR PCP:  Kennith Maes, MD SOURCE: Patient, notes from Dr. Helene Kelp  _________________________________   HISTORICAL  CHIEF COMPLAINT:  Chief Complaint  Patient presents with  . Numbness    Left great toe x 3 weeks    HISTORY OF PRESENT ILLNESS:  I had the pleasure of seeing your patient, Tasha Thomas, at [facility] for neurologic consultation regarding her left foot numbness.  She is a 53 year old woman with numbness in her left great toe for the past 3 weeks.  She feels the numbness only in the toe and more on the outer toe than the inner toe.   There is no pain but she notes some tingling, especially when the toe is being touched or squeezed.   She notes no weakness.   She does have some lower back pain.  She just noted the numbness 1 day and the onset did not appear to be associated with proceeding activity or trauma.  Compared to the onset, she feels she is doing the same.     She has no trauma and does not associate the onset with any activity, trauma.      She has breast cancer diagnosed in 2018 treated with lumpectomy, Radiation high spine Dr. Felecia Shelling and tamoxifen.   She was PR/ER positive and HER2- is still on tamoxifen.    REVIEW OF SYSTEMS: Constitutional: No fevers, chills, sweats, or change in appetite Eyes: No visual changes, double vision, eye pain Ear, nose and throat: No hearing loss, ear pain, nasal congestion, sore throat Cardiovascular: No chest pain, palpitations Respiratory: No shortness of breath at rest or with exertion.   No wheezes GastrointestinaI: No nausea, vomiting, diarrhea, abdominal pain, fecal incontinence Genitourinary: No dysuria, urinary retention or frequency.  No nocturia. Musculoskeletal: No neck pain, back pain Integumentary: No rash, pruritus, skin lesions Neurological: as above Psychiatric: No depression at this time.  No  anxiety Endocrine: No palpitations, diaphoresis, change in appetite, change in weigh or increased thirst Hematologic/Lymphatic: No anemia, purpura, petechiae. Allergic/Immunologic: No itchy/runny eyes, nasal congestion, recent allergic reactions, rashes  ALLERGIES: Allergies  Allergen Reactions  . Erythromycin Rash    HOME MEDICATIONS:  Current Outpatient Medications:  .  amLODipine (NORVASC) 5 MG tablet, Take 5 mg by mouth daily., Disp: , Rfl:  .  Biotin 5000 MCG CAPS, Take by mouth daily., Disp: , Rfl:  .  Calcium-Phosphorus-Vitamin D (CALCIUM GUMMIES PO), Take 1,000 mg by mouth daily., Disp: , Rfl:  .  cetirizine (ZYRTEC) 10 MG tablet, Take 10 mg by mouth., Disp: , Rfl:  .  famotidine (PEPCID) 20 MG tablet, Take 1 tablet (20 mg total) by mouth at bedtime., Disp: 30 tablet, Rfl: 11 .  hydrochlorothiazide (HYDRODIURIL) 25 MG tablet, Take 1 tablet (25 mg total) by mouth daily., Disp: , Rfl:  .  Multiple Vitamin (MULTIVITAMIN) tablet, Take 1 tablet by mouth daily., Disp: , Rfl:  .  pantoprazole (PROTONIX) 40 MG tablet, Take 1 tablet (40 mg total) by mouth daily., Disp: 30 tablet, Rfl: 11 .  tamoxifen (NOLVADEX) 20 MG tablet, Take 1 tablet (20 mg total) by mouth daily., Disp: 90 tablet, Rfl: 3  PAST MEDICAL HISTORY: Past Medical History:  Diagnosis Date  . Breast cancer, left ([place]) 02/27/2017  . Depression   . GERD (gastroesophageal reflux disease)   . History of radiation therapy 03/08/17-04/23/17   left breast 50.4 Gy in 28 fractions, boost 10  Gy in 5 fractions  . Hot flashes   . Hypertension   . Obesity     PAST SURGICAL HISTORY: Past Surgical History:  Procedure Laterality Date  . ABCESS DRAINAGE Left    left breast when patient was 21  . BREAST LUMPECTOMY Left 01/25/2017   2 seperate spots  . ESOPHAGOGASTRODUODENOSCOPY     2001 in [place]    FAMILY HISTORY: Family History  Problem Relation Age of Onset  . Lupus Maternal Aunt   . Lung cancer Paternal  Grandfather   . Diabetes Brother   . Colon polyps Son   . Colon cancer Sister   . Esophageal cancer Neg Hx   . Rectal cancer Neg Hx   . Stomach cancer Neg Hx     SOCIAL HISTORY:  Social History   Socioeconomic History  . Marital status: Married    Spouse name: Not on file  . Number of children: 3  . Years of education: Not on file  . Highest education level: Not on file  Occupational History  . Occupation: Best boy: [employer]  . Financial resource strain: Not on file  . Food insecurity:    Worry: Not on file    Inability: Not on file  . Transportation needs:    Medical: Not on file    Non-medical: Not on file  Tobacco Use  . Smoking status: Never Smoker  . Smokeless tobacco: Never Used  Substance and Sexual Activity  . Alcohol use: Yes    Comment: 1x per week, socially  . Drug use: No  . Sexual activity: Not on file  Lifestyle  . Physical activity:    Days per week: Not on file    Minutes per session: Not on file  . Stress: Not on file  Relationships  . Social connections:    Talks on phone: Not on file    Gets together: Not on file    Attends religious service: Not on file    Active member of club or organization: Not on file    Attends meetings of clubs or organizations: Not on file    Relationship status: Not on file  . Intimate partner violence:    Fear of current or ex partner: Not on file    Emotionally abused: Not on file    Physically abused: Not on file    Forced sexual activity: Not on file  Other Topics Concern  . Not on file  Social History Narrative  . Not on file     PHYSICAL EXAM  General: The patient is well-developed and well-nourished and in no acute distress  HEENT:  Head is [place]/AT.  Sclera are anicteric.   Neurologic Exam  Mental status: The patient is alert and oriented x 3 at the time of the examination. She has apparent normal recent and remote memory, with an apparently normal attention span and  concentration ability.    Cranial nerves: Extraocular movements are full.   Facial symmetry is present.  Facial strength is normal.    ______________________________________________  ASSESSMENT AND PLAN  Numbness of left foot  Malignant neoplasm of upper-inner quadrant of left breast in female, estrogen receptor positive ([place])   In summary, Ms. Raczkowski is a 53 year old woman with a 3-week history of numbness in the left foot.  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  The  etiology is uncertain.  It is unlikely to be related to her breast cancer.  I discussed with her that the majority of time, digital mononeuropathies will resolve on their own as the nerve regenerates.  However, sometimes the numbness will persist.  Additionally, sometimes the numbness will change in quality and become more painful.  If the foot becomes more painful, we could consider gabapentin or another agent.  However, at the time she just has mild tingling so treatment is not necessary.  With the distribution that she describes, nerve conduction studies or imaging studies are unlikely to provide useful information.  A follow-up was not scheduled.  However, if she notes no improvement after a couple months or, more importantly, if she notes that the numbness involves a larger part of the foot or other regions, then she should call us as I would want to see her for an in person evaluation.  If the numbness does change in quality and becomes more painful but stays in the current distribution, she can call and we will start gabapentin.  Thank you for asking me to see Ms. Dayhoff.  Please let me know if I can be of further assistance with her or other patients in the future.  Zayd Bonet A. Felecia Shelling, MD, [facility] 05/15/1094, 0:45 PM Certified in Neurology, Clinical Neurophysiology, Sleep Medicine and Neuroimaging  [facility] [address],  [place], [place] [address] [phone]

## 2018-10-18 ENCOUNTER — Telehealth: Payer: Self-pay | Admitting: Gastroenterology

## 2018-10-18 MED ORDER — PANTOPRAZOLE SODIUM 40 MG PO TBEC: 40.0000 mg | DELAYED_RELEASE_TABLET | Freq: Every day | ORAL | 11 refills | Status: DC

## 2018-10-18 NOTE — Telephone Encounter (Signed)
Sent prescription to preferred pharmacy.

## 2018-10-18 NOTE — Telephone Encounter (Signed)
Pt stated that she would like pantoprazole sent to [facility]: [phone].  They require a dosage PA.

## 2019-02-23 ENCOUNTER — Ambulatory Visit: Payer: 59 | Admitting: Hematology and Oncology

## 2019-02-25 NOTE — Progress Notes (Signed)
Patient Care Team: Ronita Hipps, MD as PCP - General (Family Medicine) Delice Bison, Charlestine Massed, NP as Nurse Practitioner (Hematology and Oncology) Nicholas Lose, MD as Consulting Physician (Hematology and Oncology) Gery Pray, MD as Consulting Physician (Radiation Oncology) Marylu Lund., MD as Referring Physician (Surgery)  DIAGNOSIS:    ICD-10-CM   1. Malignant neoplasm of upper-inner quadrant of left breast in female, estrogen receptor positive ([place])  C50.212    Z17.0     SUMMARY OF ONCOLOGIC HISTORY: Oncology History  Malignant neoplasm of upper-inner quadrant of left breast in female, estrogen receptor positive ([place])  01/25/2017 Surgery   Left lumpectomy: IDC grade 1 and DCIS, 9 mmsize, margins negative, 0/2 lymph nodes negative, ER 95%, PR 100 %, HER-2 negative, Ki-67 10%T1b N0 stage IA   03/08/2017 - 04/20/2017 Radiation Therapy   Adjuvant radiation   05/2017 -  Anti-estrogen oral therapy   Tamoxifen daily     CHIEF COMPLIANT: Follow-up on tamoxifen therapy  INTERVAL HISTORY: Tasha Thomas is a 53 y.o. with above-mentioned history of left breast cancer treated with lumpectomy, radiation, and who is currently on tamoxifen. I last saw her a year ago. Mammogram on 10/30/18 showed no evidence of malignancy bilaterally. She presents to the clinic today for annual follow-up.  She reports no major problems tolerating tamoxifen.  She has occasional hot flashes but lots of night sweats.  REVIEW OF SYSTEMS:   Constitutional: Denies fevers, chills or abnormal weight loss, complains of night sweats Eyes: Denies blurriness of vision Ears, nose, mouth, throat, and face: Denies mucositis or sore throat Respiratory: Denies cough, dyspnea or wheezes Cardiovascular: Denies palpitation, chest discomfort Gastrointestinal: Denies nausea, heartburn or change in bowel habits Skin: Denies abnormal skin rashes Lymphatics: Denies new lymphadenopathy or easy  bruising Neurological: Denies numbness, tingling or new weaknesses Behavioral/Psych: Mood is stable, no new changes  Extremities: No lower extremity edema Breast: denies any pain or lumps or nodules in either breasts All other systems were reviewed with the patient and are negative.  I have reviewed the past medical history, past surgical history, social history and family history with the patient and they are unchanged from previous note.  ALLERGIES:  is allergic to erythromycin.  MEDICATIONS:  Current Outpatient Medications  Medication Sig Dispense Refill   amLODipine (NORVASC) 5 MG tablet Take 5 mg by mouth daily.     Biotin 5000 MCG CAPS Take by mouth daily.     Calcium-Phosphorus-Vitamin D (CALCIUM GUMMIES PO) Take 1,000 mg by mouth daily.     cetirizine (ZYRTEC) 10 MG tablet Take 10 mg by mouth.     famotidine (PEPCID) 20 MG tablet Take 1 tablet (20 mg total) by mouth at bedtime. 30 tablet 11   hydrochlorothiazide (HYDRODIURIL) 25 MG tablet Take 1 tablet (25 mg total) by mouth daily.     Multiple Vitamin (MULTIVITAMIN) tablet Take 1 tablet by mouth daily.     pantoprazole (PROTONIX) 40 MG tablet Take 1 tablet (40 mg total) by mouth daily. 30 tablet 11   tamoxifen (NOLVADEX) 20 MG tablet Take 1 tablet (20 mg total) by mouth daily. 90 tablet 3   No current facility-administered medications for this visit.     PHYSICAL EXAMINATION: ECOG PERFORMANCE STATUS: 1 - Symptomatic but completely ambulatory  Vitals:   02/26/19 1117  BP: 122/78  Pulse: (!) 108  Resp: 18  Temp: 98.9 F (37.2 C)  SpO2: 98%   Filed Weights   02/26/19 1117  Weight: 213 lb 14.4  oz (97 kg)    GENERAL: alert, no distress and comfortable SKIN: skin color, texture, turgor are normal, no rashes or significant lesions EYES: normal, Conjunctiva are pink and non-injected, sclera clear OROPHARYNX: no exudate, no erythema and lips, buccal mucosa, and tongue normal  NECK: supple, thyroid normal size,  non-tender, without nodularity LYMPH: no palpable lymphadenopathy in the cervical, axillary or inguinal LUNGS: clear to auscultation and percussion with normal breathing effort HEART: regular rate & rhythm and no murmurs and no lower extremity edema ABDOMEN: abdomen soft, non-tender and normal bowel sounds MUSCULOSKELETAL: no cyanosis of digits and no clubbing  NEURO: alert & oriented x 3 with fluent speech, no focal motor/sensory deficits EXTREMITIES: No lower extremity edema BREAST: No palpable masses or nodules in either right or left breasts. No palpable axillary supraclavicular or infraclavicular adenopathy no breast tenderness or nipple discharge. (exam performed in the presence of a chaperone)  LABORATORY DATA:   ASSESSMENT & PLAN:  Malignant neoplasm of upper-inner quadrant of left breast in female, estrogen receptor positive ([place]) 01/26/17: Left lumpectomy: IDC grade 1 and DCIS, 9 mm size, margins negative, 0/2 lymph nodes negative, ER 95%, PR 100 %, HER-2 negative, Ki-67 10%T1b N0 stage IA  Treatment summary: 1. Adjuvant radiation therapy10/30/2018-04/20/2017 2. Adjuvant antiestrogen therapy with tamoxifen 20 mg daily 10 years versus switching her after she becomes menopausal to anastrozole.  Started January 2019  Tamoxifen toxicities:  Night sweats: I instructed her to take tamoxifen in the morning.   Breast cancer surveillance: 1.  Breast exam 02/26/2019: Benign 2. mammograms 10/30/18: Benign  Return to clinic in 1 year for follow-up  No orders of the defined types were placed in this encounter.  The patient has a good understanding of the overall plan. she agrees with it. she will call with any problems that may develop before the next visit here.  Nicholas Lose, MD 02/26/2019  Julious Oka Dorshimer am acting as scribe for Dr. Nicholas Lose.  I have reviewed the above documentation for accuracy and completeness, and I agree with the above.

## 2019-02-26 ENCOUNTER — Inpatient Hospital Stay: Payer: 59 | Attending: Hematology and Oncology | Admitting: Hematology and Oncology

## 2019-02-26 ENCOUNTER — Other Ambulatory Visit: Payer: Self-pay

## 2019-02-26 DIAGNOSIS — Z79899 Other long term (current) drug therapy: Secondary | ICD-10-CM | POA: Diagnosis not present

## 2019-02-26 DIAGNOSIS — Z923 Personal history of irradiation: Secondary | ICD-10-CM | POA: Diagnosis not present

## 2019-02-26 DIAGNOSIS — C50212 Malignant neoplasm of upper-inner quadrant of left female breast: Secondary | ICD-10-CM

## 2019-02-26 DIAGNOSIS — Z79810 Long term (current) use of selective estrogen receptor modulators (SERMs): Secondary | ICD-10-CM | POA: Insufficient documentation

## 2019-02-26 DIAGNOSIS — Z170 Estrogen receptor positive status [ER+]: Secondary | ICD-10-CM

## 2019-02-26 MED ORDER — TAMOXIFEN CITRATE 20 MG PO TABS: 20.0000 mg | ORAL_TABLET | Freq: Every day | ORAL | 3 refills | Status: DC

## 2019-02-26 NOTE — Assessment & Plan Note (Signed)
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 Adjuvant radiation therapy10/30/2018-04/20/2017 2. Adjuvant antiestrogen therapy with tamoxifen 20 mg daily 10 years versus switching her after she becomes menopausal to anastrozole.  Started January 2019  Tamoxifen toxicities: There is mild hot flashes. She has not had menstrual cycle in the last 2 months. Next year when she comes back we may consider switching her from tamoxifen to aromatase inhibitor therapy.  Breast cancer surveillance: 1.  Breast exam 02/18/2019 2. mammograms 10/30/18

## 2019-02-28 ENCOUNTER — Telehealth: Payer: Self-pay | Admitting: Hematology and Oncology

## 2019-02-28 NOTE — Telephone Encounter (Signed)
Confirmed October 2021 visit with patient.

## 2019-03-14 ENCOUNTER — Other Ambulatory Visit: Payer: Self-pay | Admitting: Gastroenterology

## 2019-06-11 ENCOUNTER — Other Ambulatory Visit: Payer: Self-pay | Admitting: Gastroenterology

## 2019-07-05 ENCOUNTER — Other Ambulatory Visit: Payer: Self-pay | Admitting: Gastroenterology

## 2019-07-13 ENCOUNTER — Telehealth: Payer: Self-pay | Admitting: Gastroenterology

## 2019-07-16 MED ORDER — PANTOPRAZOLE SODIUM 40 MG PO TBEC: 40.0000 mg | DELAYED_RELEASE_TABLET | Freq: Every day | ORAL | 0 refills | Status: DC

## 2019-07-16 NOTE — Telephone Encounter (Signed)
Sent refill to patients pharmacy. 

## 2019-09-28 ENCOUNTER — Other Ambulatory Visit: Payer: Self-pay | Admitting: *Deleted

## 2019-09-28 DIAGNOSIS — C50212 Malignant neoplasm of upper-inner quadrant of left female breast: Secondary | ICD-10-CM

## 2019-09-28 NOTE — Progress Notes (Signed)
Pt with hx of left lumpectomy stating she has found a new lump in her left breast.  Pt last mammogram June 2020.  Pt OBGYN has scheduled diagnostic mammogram for pt.  Per Dr. Lindi Adie, pt to also receive breast US with biopsy. Orders faxed to [facility].  RN will contact [facility] to schedule apt.

## 2019-10-15 NOTE — Progress Notes (Signed)
Patient Care Team: Tasha Hipps, MD as PCP - General (Family Medicine) Tasha Thomas, Tasha Massed, NP as Nurse Practitioner (Hematology and Oncology) Tasha Lose, MD as Consulting Physician (Hematology and Oncology) Tasha Pray, MD as Consulting Physician (Radiation Oncology) Tasha Lund., MD as Referring Physician (Surgery)  DIAGNOSIS:    ICD-10-CM   1. Malignant neoplasm of upper-inner quadrant of left breast in female, estrogen receptor positive ([place])  C50.212    Z17.0     SUMMARY OF ONCOLOGIC HISTORY: Oncology History  Malignant neoplasm of upper-inner quadrant of left breast in female, estrogen receptor positive ([place])  01/25/2017 Surgery   Left lumpectomy: IDC grade 1 and DCIS, 9 mmsize, margins negative, 0/2 lymph nodes negative, ER 95%, PR 100 %, HER-2 negative, Ki-67 10%T1b N0 stage IA   03/08/2017 - 04/20/2017 Radiation Therapy   Adjuvant radiation   05/2017 -  Anti-estrogen oral therapy   Tamoxifen daily     CHIEF COMPLIANT: Follow-up of left breast cancer, new palpable left breast mass  INTERVAL HISTORY: Tasha Thomas is a 54 y.o. with above-mentioned history of left breast cancer treated with lumpectomy, radiation, and who is currently on tamoxifen. She presents to the clinic today for examination after reporting a palpable left breast lump. She saw her gynecologist who obtained a mammogram at [facility].  Mammogram and ultrasound did not reveal any abnormalities.  Small palpable areas of concern did not show any evidence of malignancy  ALLERGIES:  is allergic to erythromycin.  MEDICATIONS:  Current Outpatient Medications  Medication Sig Dispense Refill  . amLODipine (NORVASC) 5 MG tablet Take 5 mg by mouth daily.    . Biotin 5000 MCG CAPS Take by mouth daily.    . Calcium-Phosphorus-Vitamin D (CALCIUM GUMMIES PO) Take 1,000 mg by mouth daily.    . cetirizine (ZYRTEC) 10 MG tablet Take 10 mg by mouth.    . famotidine (PEPCID) 20 MG tablet Take 1 tablet  (20 mg total) by mouth at bedtime. 30 tablet 11  . hydrochlorothiazide (HYDRODIURIL) 25 MG tablet Take 1 tablet (25 mg total) by mouth daily.    . Multiple Vitamin (MULTIVITAMIN) tablet Take 1 tablet by mouth daily.    . pantoprazole (PROTONIX) 40 MG tablet Take 1 tablet (40 mg total) by mouth daily. 90 tablet 0  . tamoxifen (NOLVADEX) 20 MG tablet Take 1 tablet (20 mg total) by mouth daily. 90 tablet 3   No current facility-administered medications for this visit.    PHYSICAL EXAMINATION: ECOG PERFORMANCE STATUS: 1 - Symptomatic but completely ambulatory  Vitals:   10/16/19 0831  BP: 121/90  Pulse: 85  Resp: 18  Temp: 99.1 F (37.3 C)  SpO2: 98%   Filed Weights   10/16/19 0831  Weight: 205 lb (93 kg)    BREAST: Nodular breast architecture is palpable at the site of her concern. (exam performed in the presence of a chaperone)  ASSESSMENT & PLAN:  Malignant neoplasm of upper-inner quadrant of left breast in female, estrogen receptor positive ([place]) 01/26/17: Left lumpectomy: IDC grade 1 and DCIS, 9 mm size, margins negative, 0/2 lymph nodes negative, ER 95%, PR 100 %, HER-2 negative, Ki-67 10%T1b N0 stage IA  Treatment summary: 1. Adjuvant radiation therapy10/30/2018-04/20/2017 2. Adjuvant antiestrogen therapy with tamoxifen 20 mg daily 10 years versus switching her after she becomes menopausal to anastrozole.Started January 2019  Tamoxifen toxicities: Night sweats: Continues to be a problem but she is managing it. Palpable breast lump: It was felt to be benign by mammogram  and ultrasound.  With clinical examination it feels normal breast architectural change.   Breast cancer surveillance: 1.Breast exam 10/16/2019: Benign 2.mammograms 10/30/18: Benign  Return to clinic in 1 year for follow-up  No orders of the defined types were placed in this encounter.  The patient has a good understanding of the overall plan. she agrees with it. she will call with any problems  that may develop before the next visit here.  Total time spent: 20 mins including face to face time and time spent for planning, charting and coordination of care  Tasha Lose, MD 10/16/2019  I, Tasha Thomas, am acting as scribe for Dr. Nicholas Thomas.  I have reviewed the above documentation for accuracy and completeness, and I agree with the above.

## 2019-10-16 ENCOUNTER — Other Ambulatory Visit: Payer: Self-pay

## 2019-10-16 ENCOUNTER — Inpatient Hospital Stay
Payer: No Typology Code available for payment source | Attending: Hematology and Oncology | Admitting: Hematology and Oncology

## 2019-10-16 DIAGNOSIS — Z79810 Long term (current) use of selective estrogen receptor modulators (SERMs): Secondary | ICD-10-CM | POA: Insufficient documentation

## 2019-10-16 DIAGNOSIS — Z170 Estrogen receptor positive status [ER+]: Secondary | ICD-10-CM | POA: Insufficient documentation

## 2019-10-16 DIAGNOSIS — Z79899 Other long term (current) drug therapy: Secondary | ICD-10-CM | POA: Insufficient documentation

## 2019-10-16 DIAGNOSIS — Z923 Personal history of irradiation: Secondary | ICD-10-CM | POA: Diagnosis not present

## 2019-10-16 DIAGNOSIS — C50212 Malignant neoplasm of upper-inner quadrant of left female breast: Secondary | ICD-10-CM | POA: Insufficient documentation

## 2019-10-16 MED ORDER — TAMOXIFEN CITRATE 20 MG PO TABS: 20.0000 mg | ORAL_TABLET | Freq: Every day | ORAL | 3 refills | Status: DC

## 2019-10-16 NOTE — Assessment & Plan Note (Signed)
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 Adjuvant radiation therapy10/30/2018-04/20/2017 2. Adjuvant antiestrogen therapy with tamoxifen 20 mg daily 10 years versus switching her after she becomes menopausal to anastrozole.Started January 2019  Tamoxifen toxicities: Night sweats: I instructed her to take tamoxifen in the morning.   Breast cancer surveillance: 1.Breast exam 10/16/2019: Benign 2.mammograms 10/30/18: Benign  Return to clinic in 1 year for follow-up

## 2019-11-06 ENCOUNTER — Other Ambulatory Visit: Payer: Self-pay | Admitting: Gastroenterology

## 2019-11-13 ENCOUNTER — Other Ambulatory Visit: Payer: Self-pay

## 2019-11-13 ENCOUNTER — Telehealth: Payer: Self-pay | Admitting: Gastroenterology

## 2019-11-13 MED ORDER — PANTOPRAZOLE SODIUM 40 MG PO TBEC: 40.0000 mg | DELAYED_RELEASE_TABLET | Freq: Every day | ORAL | 0 refills | Status: DC

## 2019-11-14 NOTE — Telephone Encounter (Signed)
Refill sent to pharmacy.   

## 2019-12-19 ENCOUNTER — Ambulatory Visit (INDEPENDENT_AMBULATORY_CARE_PROVIDER_SITE_OTHER): Payer: No Typology Code available for payment source | Admitting: Gastroenterology

## 2019-12-19 ENCOUNTER — Other Ambulatory Visit: Payer: Self-pay

## 2019-12-19 ENCOUNTER — Encounter: Payer: Self-pay | Admitting: Gastroenterology

## 2019-12-19 VITALS — BP 124/86 | HR 66 | Ht 64.0 in | Wt 202.4 lb

## 2019-12-19 DIAGNOSIS — K219 Gastro-esophageal reflux disease without esophagitis: Secondary | ICD-10-CM | POA: Diagnosis not present

## 2019-12-19 DIAGNOSIS — R059 Cough, unspecified: Secondary | ICD-10-CM

## 2019-12-19 DIAGNOSIS — R05 Cough: Secondary | ICD-10-CM

## 2019-12-19 MED ORDER — FAMOTIDINE 20 MG PO TABS: 20.0000 mg | ORAL_TABLET | Freq: Every evening | ORAL | 3 refills | Status: AC | PRN

## 2019-12-19 MED ORDER — PANTOPRAZOLE SODIUM 40 MG PO TBEC: 40.0000 mg | DELAYED_RELEASE_TABLET | Freq: Every day | ORAL | 4 refills | Status: DC
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
## 2019-12-19 NOTE — Patient Instructions (Addendum)
If you are age 52 or older, your body mass index should be between 23-30. Your Body mass index is 34.74 kg/m. If this is out of the aforementioned range listed, please consider follow up with your Primary Care Provider.  If you are age 40 or younger, your body mass index should be between 19-25. Your Body mass index is 34.74 kg/m. If this is out of the aformentioned range listed, please consider follow up with your Primary Care Provider.   We have sent the following medications to your pharmacy for you to pick up at your convenience: Protonix 40 mg daily. Pepcid 20 at bedtime as needed.  Follow up as needed.  Thank you,  Dr. Jackquline Denmark

## 2020-02-27 ENCOUNTER — Inpatient Hospital Stay
Payer: No Typology Code available for payment source | Attending: Hematology and Oncology | Admitting: Hematology and Oncology

## 2020-08-08 ENCOUNTER — Telehealth: Payer: Self-pay | Admitting: Hematology and Oncology

## 2020-08-08 NOTE — Telephone Encounter (Signed)
Scheduled appt per 4/1 sch msg. Pt aware.  

## 2020-09-19 ENCOUNTER — Ambulatory Visit (INDEPENDENT_AMBULATORY_CARE_PROVIDER_SITE_OTHER): Payer: No Typology Code available for payment source | Admitting: Gastroenterology

## 2020-09-19 ENCOUNTER — Other Ambulatory Visit: Payer: Self-pay

## 2020-09-19 ENCOUNTER — Encounter: Payer: Self-pay | Admitting: Gastroenterology

## 2020-09-19 VITALS — BP 122/90 | HR 82 | Ht 64.0 in | Wt 212.5 lb

## 2020-09-19 DIAGNOSIS — K219 Gastro-esophageal reflux disease without esophagitis: Secondary | ICD-10-CM | POA: Diagnosis not present

## 2020-09-19 DIAGNOSIS — R7989 Other specified abnormal findings of blood chemistry: Secondary | ICD-10-CM

## 2020-09-19 DIAGNOSIS — D509 Iron deficiency anemia, unspecified: Secondary | ICD-10-CM | POA: Diagnosis not present

## 2020-09-19 DIAGNOSIS — R945 Abnormal results of liver function studies: Secondary | ICD-10-CM

## 2020-09-19 DIAGNOSIS — Z800 Family history of malignant neoplasm of digestive organs: Secondary | ICD-10-CM | POA: Diagnosis not present

## 2020-09-19 MED ORDER — PANTOPRAZOLE SODIUM 40 MG PO TBEC: 40.0000 mg | DELAYED_RELEASE_TABLET | Freq: Every day | ORAL | 4 refills | Status: DC

## 2020-09-19 NOTE — Patient Instructions (Addendum)
If you are age 53 or older, your body mass index should be between 23-30. Your Body mass index is 36.48 kg/m. If this is out of the aforementioned range listed, please consider follow up with your Primary Care Provider.  If you are age 28 or younger, your body mass index should be between 19-25. Your Body mass index is 36.48 kg/m. If this is out of the aformentioned range listed, please consider follow up with your Primary Care Provider.   You have been scheduled for an endoscopy and colonoscopy. Please follow the written instructions given to you at your visit today. Please pick up your prep supplies at the pharmacy within the next 1-3 days. If you use inhalers (even only as needed), please bring them with you on the day of your procedure.  We have sent the following medications to your pharmacy for you to pick up at your convenience: Protonix  You have been scheduled for an abdominal ultrasound at [facility] (1st floor of hospital) on        at         . Please arrive 15 minutes prior to your appointment for registration. Make certain not to have anything to eat or drink 6 hours prior to your appointment. Should you need to reschedule your appointment, please contact radiology at [phone]. This test typically takes about 30 minutes to perform.  We have given you samples of the following medication to take: Clenpiq.  Thank you,  Dr. Jackquline Denmark
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
## 2020-09-23 ENCOUNTER — Ambulatory Visit (HOSPITAL_BASED_OUTPATIENT_CLINIC_OR_DEPARTMENT_OTHER)
Admission: RE | Admit: 2020-09-23 | Discharge: 2020-09-23 | Disposition: A | Discharge status: Home / Self Care | Payer: No Typology Code available for payment source | Source: Ambulatory Visit | Attending: Gastroenterology | Admitting: Gastroenterology

## 2020-09-23 ENCOUNTER — Other Ambulatory Visit: Payer: Self-pay

## 2020-09-23 DIAGNOSIS — R945 Abnormal results of liver function studies: Secondary | ICD-10-CM | POA: Insufficient documentation

## 2020-09-23 DIAGNOSIS — D509 Iron deficiency anemia, unspecified: Secondary | ICD-10-CM | POA: Diagnosis not present

## 2020-09-23 DIAGNOSIS — K219 Gastro-esophageal reflux disease without esophagitis: Secondary | ICD-10-CM | POA: Diagnosis present

## 2020-09-23 DIAGNOSIS — Z800 Family history of malignant neoplasm of digestive organs: Secondary | ICD-10-CM | POA: Insufficient documentation

## 2020-09-23 DIAGNOSIS — R7989 Other specified abnormal findings of blood chemistry: Secondary | ICD-10-CM

## 2020-09-25 ENCOUNTER — Ambulatory Visit (AMBULATORY_SURGERY_CENTER): Payer: No Typology Code available for payment source | Admitting: Gastroenterology

## 2020-09-25 ENCOUNTER — Other Ambulatory Visit (INDEPENDENT_AMBULATORY_CARE_PROVIDER_SITE_OTHER): Payer: No Typology Code available for payment source

## 2020-09-25 ENCOUNTER — Encounter: Payer: Self-pay | Admitting: Gastroenterology

## 2020-09-25 ENCOUNTER — Other Ambulatory Visit: Payer: Self-pay

## 2020-09-25 VITALS — BP 136/70 | HR 73 | Temp 98.7°F | Resp 10 | Ht 64.0 in | Wt 212.0 lb

## 2020-09-25 DIAGNOSIS — K3189 Other diseases of stomach and duodenum: Secondary | ICD-10-CM | POA: Diagnosis not present

## 2020-09-25 DIAGNOSIS — K648 Other hemorrhoids: Secondary | ICD-10-CM

## 2020-09-25 DIAGNOSIS — K2970 Gastritis, unspecified, without bleeding: Secondary | ICD-10-CM

## 2020-09-25 DIAGNOSIS — D509 Iron deficiency anemia, unspecified: Secondary | ICD-10-CM

## 2020-09-25 DIAGNOSIS — K219 Gastro-esophageal reflux disease without esophagitis: Secondary | ICD-10-CM

## 2020-09-25 DIAGNOSIS — Z800 Family history of malignant neoplasm of digestive organs: Secondary | ICD-10-CM

## 2020-09-25 HISTORY — PX: UPPER GASTROINTESTINAL ENDOSCOPY: SHX188

## 2020-09-25 HISTORY — PX: COLONOSCOPY: SHX174

## 2020-09-25 LAB — CBC WITH DIFFERENTIAL/PLATELET
Basophils Absolute: 0 10*3/uL (ref 0.0–0.1)
Basophils Relative: 0.2 % (ref 0.0–3.0)
Eosinophils Absolute: 0.1 10*3/uL (ref 0.0–0.7)
Eosinophils Relative: 1.3 % (ref 0.0–5.0)
HCT: 26.4 % — ABNORMAL LOW (ref 36.0–46.0)
Hemoglobin: 8.4 g/dL — ABNORMAL LOW (ref 12.0–15.0)
Lymphocytes Relative: 46 % (ref 12.0–46.0)
Lymphs Abs: 1.9 10*3/uL (ref 0.7–4.0)
MCHC: 31.8 g/dL (ref 30.0–36.0)
MCV: 66.2 fl — ABNORMAL LOW (ref 78.0–100.0)
Monocytes Absolute: 0.4 10*3/uL (ref 0.1–1.0)
Monocytes Relative: 9.2 % (ref 3.0–12.0)
Neutro Abs: 1.8 10*3/uL (ref 1.4–7.7)
Neutrophils Relative %: 43.3 % (ref 43.0–77.0)
Platelets: 270 10*3/uL (ref 150.0–400.0)
RBC: 3.99 Mil/uL (ref 3.87–5.11)
RDW: 18.4 % — ABNORMAL HIGH (ref 11.5–15.5)
WBC: 4.1 10*3/uL (ref 4.0–10.5)

## 2020-09-25 MED ORDER — SODIUM CHLORIDE 0.9 % IV SOLN: 500.0000 mL | Freq: Once | INTRAVENOUS | Status: DC

## 2020-09-25 NOTE — Progress Notes (Signed)
Called to room to assist during endoscopic procedure.  Patient ID and intended procedure confirmed with present staff. Received instructions for my participation in the procedure from the performing physician.  

## 2020-09-25 NOTE — Progress Notes (Signed)
Pt to lab before discharge 

## 2020-09-25 NOTE — Op Note (Signed)
[facility] Patient Name: Tasha Thomas Procedure Date: 09/25/2020 10:09 AM MRN: [id number] Endoscopist: Jackquline Denmark , MD Age: 55 Referring MD:  Date of Birth: 07/29/1965 Gender: Female Account #: [id number] Procedure:                Colonoscopy Indications:              Iron deficiency anemia secondary to chronic blood                            loss. FH of colon cancer (sis < 60) Medicines:                Monitored Anesthesia Care Procedure:                Pre-Anesthesia Assessment:                           - Prior to the procedure, a History and Physical                            was performed, and patient medications and                            allergies were reviewed. The patient's tolerance of                            previous anesthesia was also reviewed. The risks                            and benefits of the procedure and the sedation                            options and risks were discussed with the patient.                            All questions were answered, and informed consent                            was obtained. Prior Anticoagulants: The patient has                            taken no previous anticoagulant or antiplatelet                            agents. ASA Grade Assessment: II - A patient with                            mild systemic disease. After reviewing the risks                            and benefits, the patient was deemed in                            satisfactory condition to undergo the procedure.  After obtaining informed consent, the colonoscope                            was passed under direct vision. Throughout the                            procedure, the patient's blood pressure, pulse, and                            oxygen saturations were monitored continuously. The                            Olympus PFC-[id number] (#[id number]) Colonoscope was                            introduced through the anus  and advanced to the 2                            cm into the ileum. The colonoscopy was performed                            without difficulty. The patient tolerated the                            procedure well. The quality of the bowel                            preparation was good. The terminal ileum, ileocecal                            valve, appendiceal orifice, and rectum were                            photographed. Scope In: 10:25:53 AM Scope Out: 10:38:15 AM Scope Withdrawal Time: 0 hours 6 minutes 28 seconds  Total Procedure Duration: 0 hours 12 minutes 22 seconds  Findings:                 The colon (entire examined portion) appeared normal.                           Non-bleeding internal hemorrhoids were found during                            retroflexion. The hemorrhoids were small.                           The terminal ileum appeared normal.                           The exam was otherwise without abnormality on                            direct and retroflexion views. Complications:            No immediate complications. Estimated  Blood Loss:     Estimated blood loss: none. Impression:               - Non-bleeding internal hemorrhoids.                           - Otherwise normal colonoscopy to TI.                           - No specimens collected. Recommendation:           - Patient has a contact number available for                            emergencies. The signs and symptoms of potential                            delayed complications were discussed with the                            patient. Return to normal activities tomorrow.                            Written discharge instructions were provided to the                            patient.                           - Resume previous diet.                           - Continue present medications.                           - Repeat colonoscopy in 5 years for screening                            purposes d/t  FH.                           - The findings and recommendations were discussed                            with the patient's family.                           - Check CBC today.                           - She will also make appointment with Dr Marin Roberts                            (RE: heavy menstrual cycles) Jackquline Denmark, MD 09/25/2020 10:47:55 AM This report has been signed electronically.

## 2020-09-25 NOTE — Op Note (Signed)
[place] Patient Name: Tasha Thomas Procedure Date: 09/25/2020 10:10 AM MRN: [id number] Endoscopist: Jackquline Denmark , MD Age: 55 Referring MD:  Date of Birth: April 25, 1966 Gender: Female Account #: [id number] Procedure:                Upper GI endoscopy Indications:              Iron deficiency anemia with hemoglobin 7.5. Medicines:                Monitored Anesthesia Care Procedure:                Pre-Anesthesia Assessment:                           - Prior to the procedure, a History and Physical                            was performed, and patient medications and                            allergies were reviewed. The patient's tolerance of                            previous anesthesia was also reviewed. The risks                            and benefits of the procedure and the sedation                            options and risks were discussed with the patient.                            All questions were answered, and informed consent                            was obtained. Prior Anticoagulants: The patient has                            taken no previous anticoagulant or antiplatelet                            agents. ASA Grade Assessment: II - A patient with                            mild systemic disease. After reviewing the risks                            and benefits, the patient was deemed in                            satisfactory condition to undergo the procedure.                           After obtaining informed consent, the endoscope was  passed under direct vision. Throughout the                            procedure, the patient's blood pressure, pulse, and                            oxygen saturations were monitored continuously. The                            Endoscope was introduced through the mouth, and                            advanced to the second part of duodenum. The upper                            GI endoscopy  was accomplished without difficulty.                            The patient tolerated the procedure well. Scope In: Scope Out: Findings:                 The examined esophagus was mildly torturous but                            normal. Z-line well-defined at 38 cm, examined by                            NBI.                           Localized mild inflammation characterized by                            erythema was found in the gastric body. Biopsies                            were taken with a cold forceps for histology. No                            residual or recurrent polyps. A small submucosal 8                            mm lipoma was noted in the antrum                           The examined duodenum was normal. Biopsies for                            histology were taken with a cold forceps for                            evaluation of celiac disease. Complications:            No immediate complications. Estimated Blood Loss:     Estimated blood loss: none. Impression:               -  Minimal gastritis. Recommendation:           - Patient has a contact number available for                            emergencies. The signs and symptoms of potential                            delayed complications were discussed with the                            patient. Return to normal activities tomorrow.                            Written discharge instructions were provided to the                            patient.                           - Resume previous diet.                           - Continue present medications.                           - Await pathology results.                           - The findings and recommendations were discussed                            with the patient's family.                           - No explanation of IDA. Proceed with colonoscopy. Jackquline Denmark, MD 09/25/2020 10:44:06 AM This report has been signed electronically.

## 2020-09-25 NOTE — Patient Instructions (Signed)
Await pathology  Next colonoscopy in 5 years due to family history  Continue your normal medications  Please go to lab before leaving today  Make appointment with Dr. Marin Roberts  YOU HAD AN ENDOSCOPIC PROCEDURE TODAY AT THE [facility]:   Refer to the procedure report that was given to you for any specific questions about what was found during the examination.  If the procedure report does not answer your questions, please call your gastroenterologist to clarify.  If you requested that your care partner not be given the details of your procedure findings, then the procedure report has been included in a sealed envelope for you to review at your convenience later.  YOU SHOULD EXPECT: Some feelings of bloating in the abdomen. Passage of more gas than usual.  Walking can help get rid of the air that was put into your GI tract during the procedure and reduce the bloating. If you had a lower endoscopy (such as a colonoscopy or flexible sigmoidoscopy) you may notice spotting of blood in your stool or on the toilet paper. If you underwent a bowel prep for your procedure, you may not have a normal bowel movement for a few days.  Please Note:  You might notice some irritation and congestion in your nose or some drainage.  This is from the oxygen used during your procedure.  There is no need for concern and it should clear up in a day or so.  SYMPTOMS TO REPORT IMMEDIATELY:   Following lower endoscopy (colonoscopy or flexible sigmoidoscopy):  Excessive amounts of blood in the stool  Significant tenderness or worsening of abdominal pains  Swelling of the abdomen that is new, acute  Fever of 100F or higher   Following upper endoscopy (EGD)  Vomiting of blood or coffee ground material  New chest pain or pain under the shoulder blades  Painful or persistently difficult swallowing  New shortness of breath  Fever of 100F or higher  Black, tarry-looking stools  For urgent or emergent  issues, a gastroenterologist can be reached at any hour by calling [phone]. Do not use MyChart messaging for urgent concerns.    DIET:  We do recommend a small meal at first, but then you may proceed to your regular diet.  Drink plenty of fluids but you should avoid alcoholic beverages for 24 hours.  ACTIVITY:  You should plan to take it easy for the rest of today and you should NOT DRIVE or use heavy machinery until tomorrow (because of the sedation medicines used during the test).    FOLLOW UP: Our staff will call the number listed on your records 48-72 hours following your procedure to check on you and address any questions or concerns that you may have regarding the information given to you following your procedure. If we do not reach you, we will leave a message.  We will attempt to reach you two times.  During this call, we will ask if you have developed any symptoms of COVID 19. If you develop any symptoms (ie: fever, flu-like symptoms, shortness of breath, cough etc.) before then, please call [phone].  If you test positive for Covid 19 in the 2 weeks post procedure, please call and report this information to [place].    If any biopsies were taken you will be contacted by phone or by letter within the next 1-3 weeks.  Please call us at [phone] if you have not heard about the biopsies in 3 weeks.  SIGNATURES/CONFIDENTIALITY: You and/or your care partner have signed paperwork which will be entered into your electronic medical record.  These signatures attest to the fact that that the information above on your After Visit Summary has been reviewed and is understood.  Full responsibility of the confidentiality of this discharge information lies with you and/or your care-partner.

## 2020-09-25 NOTE — Progress Notes (Signed)
Report to PACU, RN, vss, BBS= Clear.  

## 2020-09-25 NOTE — Progress Notes (Signed)
Pt's states no medical or surgical changes since previsit or office visit. 

## 2020-09-29 ENCOUNTER — Telehealth: Payer: Self-pay

## 2020-09-29 NOTE — Telephone Encounter (Signed)
LVM

## 2020-10-01 ENCOUNTER — Encounter: Payer: Self-pay | Admitting: Gastroenterology

## 2020-10-05 ENCOUNTER — Emergency Department (HOSPITAL_COMMUNITY): Payer: No Typology Code available for payment source

## 2020-10-05 ENCOUNTER — Emergency Department (HOSPITAL_COMMUNITY)
Admission: EM | Admit: 2020-10-05 | Discharge: 2020-10-05 | Disposition: A | Discharge status: Home / Self Care | Payer: No Typology Code available for payment source | Attending: Emergency Medicine | Admitting: Emergency Medicine

## 2020-10-05 ENCOUNTER — Other Ambulatory Visit: Payer: Self-pay

## 2020-10-05 ENCOUNTER — Encounter (HOSPITAL_COMMUNITY): Payer: Self-pay | Admitting: Emergency Medicine

## 2020-10-05 DIAGNOSIS — R1031 Right lower quadrant pain: Secondary | ICD-10-CM | POA: Insufficient documentation

## 2020-10-05 DIAGNOSIS — R531 Weakness: Secondary | ICD-10-CM | POA: Insufficient documentation

## 2020-10-05 DIAGNOSIS — R1030 Lower abdominal pain, unspecified: Secondary | ICD-10-CM

## 2020-10-05 DIAGNOSIS — Z79899 Other long term (current) drug therapy: Secondary | ICD-10-CM | POA: Insufficient documentation

## 2020-10-05 DIAGNOSIS — R1032 Left lower quadrant pain: Secondary | ICD-10-CM | POA: Diagnosis not present

## 2020-10-05 DIAGNOSIS — R42 Dizziness and giddiness: Secondary | ICD-10-CM | POA: Insufficient documentation

## 2020-10-05 DIAGNOSIS — Z853 Personal history of malignant neoplasm of breast: Secondary | ICD-10-CM | POA: Diagnosis not present

## 2020-10-05 DIAGNOSIS — I10 Essential (primary) hypertension: Secondary | ICD-10-CM | POA: Diagnosis not present

## 2020-10-05 DIAGNOSIS — D649 Anemia, unspecified: Secondary | ICD-10-CM | POA: Diagnosis not present

## 2020-10-05 DIAGNOSIS — R61 Generalized hyperhidrosis: Secondary | ICD-10-CM | POA: Diagnosis not present

## 2020-10-05 LAB — COMPREHENSIVE METABOLIC PANEL
ALT: 17 U/L (ref 0–44)
AST: 22 U/L (ref 15–41)
Albumin: 3.8 g/dL (ref 3.5–5.0)
Alkaline Phosphatase: 53 U/L (ref 38–126)
Anion gap: 13 (ref 5–15)
BUN: 16 mg/dL (ref 6–20)
CO2: 20 mmol/L — ABNORMAL LOW (ref 22–32)
Calcium: 8.8 mg/dL — ABNORMAL LOW (ref 8.9–10.3)
Chloride: 104 mmol/L (ref 98–111)
Creatinine, Ser: 0.75 mg/dL (ref 0.44–1.00)
GFR, Estimated: >60 mL/min (ref >60)
Glucose, Bld: 104 mg/dL — ABNORMAL HIGH (ref 70–99)
Potassium: 3.2 mmol/L — ABNORMAL LOW (ref 3.5–5.1)
Sodium: 137 mmol/L (ref 135–145)
Total Bilirubin: 0.4 mg/dL (ref 0.3–1.2)
Total Protein: 7.3 g/dL (ref 6.5–8.1)

## 2020-10-05 LAB — CBC WITH DIFFERENTIAL/PLATELET
Abs Immature Granulocytes: 0.01 10*3/uL (ref 0.00–0.07)
Basophils Absolute: 0 10*3/uL (ref 0.0–0.1)
Basophils Relative: 0 %
Eosinophils Absolute: 0 10*3/uL (ref 0.0–0.5)
Eosinophils Relative: 1 %
HCT: 30.2 % — ABNORMAL LOW (ref 36.0–46.0)
Hemoglobin: 8.8 g/dL — ABNORMAL LOW (ref 12.0–15.0)
Immature Granulocytes: 0 %
Lymphocytes Relative: 28 %
Lymphs Abs: 1.6 10*3/uL (ref 0.7–4.0)
MCH: 20.7 pg — ABNORMAL LOW (ref 26.0–34.0)
MCHC: 29.1 g/dL — ABNORMAL LOW (ref 30.0–36.0)
MCV: 70.9 fL — ABNORMAL LOW (ref 80.0–100.0)
Monocytes Absolute: 0.6 10*3/uL (ref 0.1–1.0)
Monocytes Relative: 10 %
Neutro Abs: 3.5 10*3/uL (ref 1.7–7.7)
Neutrophils Relative %: 61 %
Platelets: 287 10*3/uL (ref 150–400)
RBC: 4.26 MIL/uL (ref 3.87–5.11)
RDW: 19.2 % — ABNORMAL HIGH (ref 11.5–15.5)
WBC: 5.7 10*3/uL (ref 4.0–10.5)
nRBC: 0 % (ref 0.0–0.2)

## 2020-10-05 LAB — URINALYSIS, ROUTINE W REFLEX MICROSCOPIC
Bilirubin Urine: NEGATIVE
Glucose, UA: NEGATIVE mg/dL
Hgb urine dipstick: NEGATIVE
Ketones, ur: NEGATIVE mg/dL
Leukocytes,Ua: NEGATIVE
Nitrite: NEGATIVE
Protein, ur: NEGATIVE mg/dL
Specific Gravity, Urine: 1.006 (ref 1.005–1.030)
pH: 6 (ref 5.0–8.0)

## 2020-10-05 LAB — PREGNANCY, URINE: Preg Test, Ur: NEGATIVE

## 2020-10-05 LAB — LIPASE, BLOOD: Lipase: 32 U/L (ref 11–51)

## 2020-10-05 NOTE — ED Notes (Signed)
Patient transported to CT 

## 2020-10-05 NOTE — Discharge Instructions (Signed)
The lining of the uterus, the endometrium, was slightly more thick than normal.  You will definitely need to see OB/GYN.  They need to make sure this is not endometrial cancer.  There is a small structure next to the left ureter that might be something called a ureterocele. You can see urology to have this checked out further.

## 2020-10-05 NOTE — ED Triage Notes (Signed)
Pt BIB EMS from home c/o generalized weakness and abdominal pain x 2 hours after using the bathroom. 20G RH. Hx of breast cancer - left arm restricted. VSS.

## 2020-10-05 NOTE — ED Provider Notes (Signed)
[facility] DEPT Provider Note   CSN: [id number] Arrival date & time: 10/05/20  1938     History Chief Complaint  Patient presents with  . Abdominal Pain  . Weakness    Tasha Thomas is a 55 y.o. female.  She developed some lower abdominal pain and thought that maybe it was gas.  She had a bowel movement, and the pain intensified.  She became dizzy, sweaty, and felt lightheaded.  Currently she is asymptomatic.  Recent endoscopy and colonoscopy for anemia which she thinks is likely secondary to heavy menstrual periods.  The history is provided by the patient.  Abdominal Pain Pain location:  LLQ and RLQ Pain quality: sharp   Pain radiates to:  Does not radiate Pain severity:  Severe Onset quality:  Sudden Timing:  Constant Progression:  Resolved Chronicity:  New Context: not sick contacts, not suspicious food intake and not trauma   Relieved by:  Nothing Worsened by:  Nothing Ineffective treatments:  None tried Associated symptoms: no anorexia, no chest pain, no chills, no cough, no diarrhea, no dysuria, no fever, no hematuria, no nausea, no shortness of breath, no sore throat and no vomiting   Weakness Associated symptoms: abdominal pain   Associated symptoms: no anorexia, no arthralgias, no chest pain, no cough, no diarrhea, no dysuria, no fever, no nausea, no seizures, no shortness of breath and no vomiting        Past Medical History:  Diagnosis Date  . Breast cancer, left ([place]) 02/27/2017  . Depression   . GERD (gastroesophageal reflux disease)   . History of radiation therapy 03/08/17-04/23/17   left breast 50.4 Gy in 28 fractions, boost 10 Gy in 5 fractions  . Hot flashes   . Hypertension   . Obesity     Patient Active Problem List   Diagnosis Date Noted  . Numbness of left foot 10/17/2018  . Malignant neoplasm of upper-inner quadrant of left breast in female, estrogen receptor positive ([place]) 02/14/2017  . Postoperative  examination 02/07/2017  . BMI 35.0-35.9,adult 01/04/2017  . Chronic cough 04/28/2013    Past Surgical History:  Procedure Laterality Date  . ABCESS DRAINAGE Left    left breast when patient was 21  . BREAST LUMPECTOMY Left 01/25/2017   2 seperate spots  . COLONOSCOPY  2020  . COLONOSCOPY  09/25/2020  . ESOPHAGOGASTRODUODENOSCOPY     2001 in [place]  . UPPER GASTROINTESTINAL ENDOSCOPY  09/25/2020     OB History   No obstetric history on file.     Family History  Problem Relation Age of Onset  . Lupus Maternal Aunt   . Lung cancer Paternal Grandfather   . Diabetes Brother   . Colon polyps Son   . Colon cancer Sister   . Esophageal cancer Neg Hx   . Rectal cancer Neg Hx   . Stomach cancer Neg Hx     Social History   Tobacco Use  . Smoking status: Never Smoker  . Smokeless tobacco: Never Used  Vaping Use  . Vaping Use: Never used  Substance Use Topics  . Alcohol use: Yes    Comment: 1x per week, socially  . Drug use: No    Home Medications Prior to Admission medications   Medication Sig Start Date End Date Taking? Authorizing Provider  amLODipine (NORVASC) 5 MG tablet Take 5 mg by mouth daily.    [provider]  Biotin 5000 MCG CAPS Take by mouth daily.    [provider]  Calcium-Phosphorus-Vitamin D (CALCIUM GUMMIES PO) Take 1,000 mg by mouth daily.    [provider]  cetirizine (ZYRTEC) 10 MG tablet Take 10 mg by mouth.    [provider]  famotidine (PEPCID) 20 MG tablet Take 1 tablet (20 mg total) by mouth at bedtime as needed for heartburn or indigestion. Patient not taking: Reported on 09/25/2020 12/19/19   Jackquline Denmark, MD  hydrochlorothiazide (HYDRODIURIL) 25 MG tablet Take 1 tablet (25 mg total) by mouth daily. 02/17/18   Nicholas Lose, MD  iron polysaccharides (NIFEREX) 150 MG capsule Take 150 mg by mouth daily. 09/03/20   [provider]  Multiple Vitamin (MULTIVITAMIN) tablet Take 1 tablet by mouth  daily. Patient not taking: Reported on 09/25/2020    [provider]  pantoprazole (PROTONIX) 40 MG tablet Take 1 tablet (40 mg total) by mouth daily. 09/19/20   Jackquline Denmark, MD  tamoxifen (NOLVADEX) 20 MG tablet Take 1 tablet (20 mg total) by mouth daily. 10/16/19   Nicholas Lose, MD    Allergies    Erythromycin  Review of Systems   Review of Systems  Constitutional: Negative for chills and fever.  HENT: Negative for ear pain and sore throat.   Eyes: Negative for pain and visual disturbance.  Respiratory: Negative for cough and shortness of breath.   Cardiovascular: Negative for chest pain and palpitations.  Gastrointestinal: Positive for abdominal pain. Negative for anorexia, diarrhea, nausea and vomiting.  Genitourinary: Negative for dysuria and hematuria.  Musculoskeletal: Negative for arthralgias and back pain.  Skin: Negative for color change and rash.  Neurological: Positive for weakness. Negative for seizures and syncope.  All other systems reviewed and are negative.   Physical Exam Updated Vital Signs BP 124/81 (BP Location: Right Arm)   Pulse 83   Temp 99.2 F (37.3 C) (Oral)   Resp 18   SpO2 100%   Physical Exam Vitals and nursing note reviewed.  Constitutional:      General: She is not in acute distress.    Appearance: She is well-developed.  HENT:     Head: Normocephalic and atraumatic.  Eyes:     Conjunctiva/sclera: Conjunctivae normal.  Cardiovascular:     Rate and Rhythm: Normal rate and regular rhythm.     Heart sounds: No murmur heard.   Pulmonary:     Effort: Pulmonary effort is normal. No respiratory distress.     Breath sounds: Normal breath sounds.  Abdominal:     Palpations: Abdomen is soft.     Tenderness: There is abdominal tenderness in the left lower quadrant. There is no guarding or rebound.  Musculoskeletal:     Cervical back: Neck supple.  Skin:    General: Skin is warm and dry.  Neurological:     Mental Status: She is  alert.     ED Results / Procedures / Treatments   Labs (all labs ordered are listed, but only abnormal results are displayed) Labs Reviewed  CBC WITH DIFFERENTIAL/PLATELET - Abnormal; Notable for the following components:      Result Value   Hemoglobin 8.8 (*)    HCT 30.2 (*)    MCV 70.9 (*)    MCH 20.7 (*)    MCHC 29.1 (*)    RDW 19.2 (*)    All other components within normal limits  COMPREHENSIVE METABOLIC PANEL - Abnormal; Notable for the following components:   Potassium 3.2 (*)    CO2 20 (*)    Glucose, Bld 104 (*)  Calcium 8.8 (*)    All other components within normal limits  LIPASE, BLOOD  URINALYSIS, ROUTINE W REFLEX MICROSCOPIC  PREGNANCY, URINE    EKG None  Radiology CT Abdomen Pelvis Wo Contrast  Result Date: 10/05/2020 CLINICAL DATA:  55 year old female with abdominal pain. Concern for acute diverticulitis. EXAM: CT ABDOMEN AND PELVIS WITHOUT CONTRAST TECHNIQUE: Multidetector CT imaging of the abdomen and pelvis was performed following the standard protocol without IV contrast. COMPARISON:  Abdominal ultrasound dated 09/23/2020. FINDINGS: Evaluation of this exam is limited in the absence of intravenous contrast. Lower chest: The visualized lung bases are clear. No intra-abdominal free air or free fluid. Hepatobiliary: Fatty liver. No intrahepatic biliary dilatation. Layering stone within the gallbladder. No pericholecystic fluid. Pancreas: Unremarkable. No pancreatic ductal dilatation or surrounding inflammatory changes. Spleen: Normal in size without focal abnormality. Adrenals/Urinary Tract: The adrenal glands unremarkable. Moderate left renal parenchyma atrophy with cortical scarring. Several small nonobstructing left renal calculi measure up to 2-3 mm. There is no hydronephrosis on either side. There is bilateral extrarenal pelvis with mild pelviectasis. There is focal area of bulbous dilatation of the distal left ureter. An ill-defined rounded structure in the  posterior bladder adjacent to the left UVJ may represent a ureterocele. Retrograde cystogram or urogram may provide better evaluation. Stomach/Bowel: There is no bowel obstruction or active inflammation. The appendix is unremarkable. Vascular/Lymphatic: The abdominal aorta and IVC unremarkable. No portal venous gas. There is no adenopathy. Reproductive: The uterus is anteverted. Anterior uterine fibroid noted. There is distension of the upper endometrium measuring up to 4 cm in thickness. Findings may represent endometrial hyperplasia, polyp, or mass/stricture/neoplasm causing outflow obstruction. Further evaluation with pelvic ultrasound and/or hysteroscopy on a nonemergent/outpatient basis recommended. The ovaries are grossly unremarkable. Other: None Musculoskeletal: Degenerative changes of the lower lumbar spine. No acute osseous pathology. IMPRESSION: 1. No bowel obstruction. Normal appendix. 2. Distended and thickened endometrium. Further evaluation with pelvic ultrasound and/or hysteroscopy on a nonemergent/outpatient basis recommended. 3. Moderate left renal parenchyma atrophy with cortical scarring. Small nonobstructing left renal calculi. No hydronephrosis. 4. Ill-defined rounded structure in the posterior bladder adjacent to the left UVJ may represent a ureterocele. Further evaluation with retrograde cystogram or cystoscopy recommended. 5. Cholelithiasis. 6. Fatty liver. Electronically Signed   By: Anner Crete M.D.   On: 10/05/2020 20:39    Procedures Procedures   Medications Ordered in ED Medications - No data to display  ED Course  I have reviewed the triage vital signs and the nursing notes.  Pertinent labs & imaging results that were available during my care of the patient were reviewed by me and considered in my medical decision making (see chart for details).    MDM Rules/Calculators/A&P                          ANH BIGOS presents with sudden, abrupt onset abdominal  pain.  She also felt lightheaded, dizzy, and diffusely weak afterwards.  When I saw her, she was asymptomatic.  She does have a known history of anemia and is in the process of evaluation for this.   Final Clinical Impression(s) / ED Diagnoses Final diagnoses:  Lower abdominal pain  Weakness  Anemia, unspecified type    Rx / DC Orders ED Discharge Orders    None       Arnaldo Natal, MD 10/05/20 2316

## 2020-10-22 NOTE — Assessment & Plan Note (Signed)
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 Adjuvant radiation therapy10/30/2018-04/20/2017 2. Adjuvant antiestrogen therapy with tamoxifen 20 mg daily 10 years versus switching her after she becomes menopausal to anastrozole.Started January 2019  Tamoxifen toxicities:Night sweats: Continues to be a problem but she is managing it. Palpable breast lump: It was felt to be benign by mammogram and ultrasound.  With clinical examination it feels normal breast architectural change.  Breast cancer surveillance: 1.Breast exam 10/23/2020: Benign 2.mammograms6/22/20: Benign  CT abd and pelvis: 10/05/20: Thickened endometrium, mod left renal parenchymal atrophyureterocele, fatty liver Return to clinic in 1 year for follow-up

## 2020-10-22 NOTE — Progress Notes (Signed)
Patient Care Team: Lilian Coma., MD as PCP - General (Internal Medicine) Delice Bison, Charlestine Massed, NP as Nurse Practitioner (Hematology and Oncology) Nicholas Lose, MD as Consulting Physician (Hematology and Oncology) Gery Pray, MD as Consulting Physician (Radiation Oncology) Marylu Lund., MD as Referring Physician (Surgery)  DIAGNOSIS:    ICD-10-CM   1. Malignant neoplasm of upper-inner quadrant of left breast in female, estrogen receptor positive ([place])  C50.212    Z17.0     2. Iron deficiency anemia due to chronic blood loss  D50.0       SUMMARY OF ONCOLOGIC HISTORY: Oncology History  Malignant neoplasm of upper-inner quadrant of left breast in female, estrogen receptor positive ([place])  01/25/2017 Surgery   Left lumpectomy: IDC grade 1 and DCIS, 9 mmsize, margins negative, 0/2 lymph nodes negative, ER 95%, PR 100 %, HER-2 negative, Ki-67 10%T1b N0 stage IA    03/08/2017 - 04/20/2017 Radiation Therapy   Adjuvant radiation    05/2017 -  Anti-estrogen oral therapy   Tamoxifen daily      CHIEF COMPLIANT: Follow-up of left breast cancer  INTERVAL HISTORY: Tasha Thomas is a 55 y.o. with above-mentioned history of left breast cancer treated with lumpectomy, radiation, and who is currently on tamoxifen. She presents to the clinic today for follow-up. Her major complaints recently have been related to heavy menstrual blood loss and iron deficiency anemia.  She is working with a gynecologist and gastroenterologist to figure this out.  She was started on oral iron therapy couple of months ago and it has helped her.  Her ferritin was as low as 5 at one point.  She feels so tired that she cannot do any exercise because she feels dizzy and lightheaded.  Denies any lumps or nodules in the breast.  Most recent mammogram was done 2 days ago at [facility] which was normal.  ALLERGIES:  is allergic to erythromycin.  MEDICATIONS:  Current Outpatient Medications  Medication Sig  Dispense Refill   amLODipine (NORVASC) 5 MG tablet Take 5 mg by mouth daily.     Biotin 5000 MCG CAPS Take by mouth daily.     Calcium-Phosphorus-Vitamin D (CALCIUM GUMMIES PO) Take 1,000 mg by mouth daily.     cetirizine (ZYRTEC) 10 MG tablet Take 10 mg by mouth.     famotidine (PEPCID) 20 MG tablet Take 1 tablet (20 mg total) by mouth at bedtime as needed for heartburn or indigestion. (Patient not taking: Reported on 09/25/2020) 30 tablet 3   hydrochlorothiazide (HYDRODIURIL) 25 MG tablet Take 1 tablet (25 mg total) by mouth daily.     iron polysaccharides (NIFEREX) 150 MG capsule Take 150 mg by mouth daily.     Multiple Vitamin (MULTIVITAMIN) tablet Take 1 tablet by mouth daily. (Patient not taking: Reported on 09/25/2020)     pantoprazole (PROTONIX) 40 MG tablet Take 1 tablet (40 mg total) by mouth daily. 90 tablet 4   tamoxifen (NOLVADEX) 20 MG tablet Take 1 tablet (20 mg total) by mouth daily. 90 tablet 3   No current facility-administered medications for this visit.    PHYSICAL EXAMINATION: ECOG PERFORMANCE STATUS: 1 - Symptomatic but completely ambulatory  Vitals:   10/23/20 0811  BP: 121/74  Pulse: 83  Resp: 18  Temp: 97.6 F (36.4 C)  SpO2: 99%   Filed Weights   10/23/20 0811  Weight: 210 lb 6.4 oz (95.4 kg)    BREAST: No palpable masses or nodules in either right or left breasts. No palpable  axillary supraclavicular or infraclavicular adenopathy no breast tenderness or nipple discharge. (exam performed in the presence of a chaperone)  LABORATORY DATA:  I have reviewed the data as listed CMP Latest Ref Rng & Units 10/05/2020  Glucose 70 - 99 mg/dL 104(H)  BUN 6 - 20 mg/dL 16  Creatinine 0.44 - 1.00 mg/dL 0.75  Sodium 135 - 145 mmol/L 137  Potassium 3.5 - 5.1 mmol/L 3.2(L)  Chloride 98 - 111 mmol/L 104  CO2 22 - 32 mmol/L 20(L)  Calcium 8.9 - 10.3 mg/dL 8.8(L)  Total Protein 6.5 - 8.1 g/dL 7.3  Total Bilirubin 0.3 - 1.2 mg/dL 0.4  Alkaline Phos 38 - 126 U/L 53   AST 15 - 41 U/L 22  ALT 0 - 44 U/L 17    Lab Results  Component Value Date   WBC 5.7 10/05/2020   HGB 8.8 (L) 10/05/2020   HCT 30.2 (L) 10/05/2020   MCV 70.9 (L) 10/05/2020   PLT 287 10/05/2020   NEUTROABS 3.5 10/05/2020    ASSESSMENT & PLAN:  Malignant neoplasm of upper-inner quadrant of left breast in female, estrogen receptor positive ([place]) 01/26/17: Left lumpectomy: IDC grade 1 and DCIS, 9 mm size, margins negative, 0/2 lymph nodes negative, ER 95%, PR 100 %, HER-2 negative, Ki-67 10%T1b N0 stage IA   Treatment summary: 1. Adjuvant radiation therapy 03/08/2017-04/20/2017 2. Adjuvant antiestrogen therapy with tamoxifen 20 mg daily 10 years versus switching her after she becomes menopausal to anastrozole.  Started January 2019   Tamoxifen toxicities:  Night sweats: Continues to be a problem but she is managing it. Palpable breast lump: It was felt to be benign by mammogram and ultrasound.  With clinical examination it feels normal breast architectural change.    Breast cancer surveillance: 1.  Breast exam 10/23/2020: Benign 2. mammograms 10/19/20 at [facility]: Benign   CT abd and pelvis: 10/05/20: Thickened endometrium, mod left renal parenchymal atrophyureterocele, fatty liver    Iron deficiency anemia due to chronic blood loss Most likely related to heavy menstrual bleeding. She has been on oral iron therapy for the past 2 to 3 months. She feels extremely tired and dizzy when she bends forward and has profound ice chip cravings. Most recent hemoglobin: 9.4 (improved from 7) Ferritin was 5  Plan: 3 doses of IV Venofer Her menstrual cycles have stopped over the past month and she is hoping that she is going to go into menopause soon. She has a pelvic ultrasound planned.  Return to clinic in 6 months with labs and follow-up the following day with a telephone visit  No orders of the defined types were placed in this encounter.  The patient has a good understanding of the  overall plan. she agrees with it. she will call with any problems that may develop before the next visit here.  Total time spent: 30 mins including face to face time and time spent for planning, charting and coordination of care  Rulon Eisenmenger, MD, MPH 10/23/2020  I, Thana Ates, am acting as scribe for Dr. Nicholas Lose.  I have reviewed the above documentation for accuracy and completeness, and I agree with the above.

## 2020-10-23 ENCOUNTER — Inpatient Hospital Stay
Payer: No Typology Code available for payment source | Attending: Hematology and Oncology | Admitting: Hematology and Oncology

## 2020-10-23 ENCOUNTER — Other Ambulatory Visit: Payer: Self-pay

## 2020-10-23 DIAGNOSIS — D500 Iron deficiency anemia secondary to blood loss (chronic): Secondary | ICD-10-CM | POA: Insufficient documentation

## 2020-10-23 DIAGNOSIS — Z170 Estrogen receptor positive status [ER+]: Secondary | ICD-10-CM | POA: Insufficient documentation

## 2020-10-23 DIAGNOSIS — Z79810 Long term (current) use of selective estrogen receptor modulators (SERMs): Secondary | ICD-10-CM | POA: Insufficient documentation

## 2020-10-23 DIAGNOSIS — C50212 Malignant neoplasm of upper-inner quadrant of left female breast: Secondary | ICD-10-CM | POA: Insufficient documentation

## 2020-10-23 DIAGNOSIS — Z923 Personal history of irradiation: Secondary | ICD-10-CM | POA: Diagnosis not present

## 2020-10-23 DIAGNOSIS — N920 Excessive and frequent menstruation with regular cycle: Secondary | ICD-10-CM | POA: Diagnosis not present

## 2020-10-23 MED ORDER — TAMOXIFEN CITRATE 20 MG PO TABS
20.0000 mg | ORAL_TABLET | Freq: Every day | ORAL | 3 refills | Status: DC
Start: 2020-10-23 — End: 2022-04-26

## 2020-10-23 NOTE — Assessment & Plan Note (Signed)
Most likely related to heavy menstrual bleeding. She has been on oral iron therapy for the past 2 to 3 months. She feels extremely tired and dizzy when she bends forward and has profound ice chip cravings. Most recent hemoglobin: 9.4 (improved from 7) Ferritin was 5  Plan: 3 doses of IV Venofer Her menstrual cycles have stopped over the past month and she is hoping that she is going to go into menopause soon. She has a pelvic ultrasound planned.

## 2020-11-04 ENCOUNTER — Inpatient Hospital Stay: Payer: No Typology Code available for payment source

## 2020-11-04 ENCOUNTER — Other Ambulatory Visit: Payer: Self-pay

## 2020-11-04 VITALS — BP 122/78 | HR 68 | Temp 98.2°F | Resp 16

## 2020-11-04 DIAGNOSIS — D500 Iron deficiency anemia secondary to blood loss (chronic): Secondary | ICD-10-CM | POA: Diagnosis not present

## 2020-11-04 DIAGNOSIS — Z170 Estrogen receptor positive status [ER+]: Secondary | ICD-10-CM

## 2020-11-04 MED ORDER — DIPHENHYDRAMINE HCL 25 MG PO CAPS
50.0000 mg | ORAL_CAPSULE | Freq: Once | ORAL | Status: AC
  Administered 2020-11-04: 50 mg via ORAL

## 2020-11-04 MED ORDER — ACETAMINOPHEN 325 MG PO TABS
ORAL_TABLET | ORAL | Status: AC
  Filled 2020-11-04: qty 2

## 2020-11-04 MED ORDER — ACETAMINOPHEN 325 MG PO TABS
650.0000 mg | ORAL_TABLET | Freq: Once | ORAL | Status: AC
  Administered 2020-11-04: 650 mg via ORAL

## 2020-11-04 MED ORDER — SODIUM CHLORIDE 0.9 % IV SOLN
Freq: Once | INTRAVENOUS | Status: AC
Start: 2020-11-04 — End: 2020-11-04
  Filled 2020-11-04: qty 250

## 2020-11-04 MED ORDER — DIPHENHYDRAMINE HCL 25 MG PO CAPS
ORAL_CAPSULE | ORAL | Status: AC
  Filled 2020-11-04: qty 2

## 2020-11-04 MED ORDER — IRON SUCROSE 20 MG/ML IV SOLN
300.0000 mg | Freq: Once | INTRAVENOUS | Status: AC
  Administered 2020-11-04: 300 mg via INTRAVENOUS
  Filled 2020-11-04: qty 300

## 2020-11-04 NOTE — Patient Instructions (Addendum)

## 2020-11-14 ENCOUNTER — Inpatient Hospital Stay: Payer: No Typology Code available for payment source | Attending: Hematology and Oncology

## 2020-11-14 ENCOUNTER — Other Ambulatory Visit: Payer: Self-pay

## 2020-11-14 VITALS — BP 120/85 | HR 70 | Temp 98.4°F | Resp 18

## 2020-11-14 DIAGNOSIS — D500 Iron deficiency anemia secondary to blood loss (chronic): Secondary | ICD-10-CM | POA: Insufficient documentation

## 2020-11-14 DIAGNOSIS — C50212 Malignant neoplasm of upper-inner quadrant of left female breast: Secondary | ICD-10-CM | POA: Insufficient documentation

## 2020-11-14 DIAGNOSIS — Z170 Estrogen receptor positive status [ER+]: Secondary | ICD-10-CM | POA: Diagnosis not present

## 2020-11-14 DIAGNOSIS — N920 Excessive and frequent menstruation with regular cycle: Secondary | ICD-10-CM | POA: Insufficient documentation

## 2020-11-14 MED ORDER — DIPHENHYDRAMINE HCL 25 MG PO CAPS
ORAL_CAPSULE | ORAL | Status: AC
  Filled 2020-11-14: qty 2

## 2020-11-14 MED ORDER — DIPHENHYDRAMINE HCL 25 MG PO CAPS
50.0000 mg | ORAL_CAPSULE | Freq: Once | ORAL | Status: AC
  Administered 2020-11-14: 50 mg via ORAL

## 2020-11-14 MED ORDER — ACETAMINOPHEN 325 MG PO TABS
650.0000 mg | ORAL_TABLET | Freq: Once | ORAL | Status: AC
Start: 2020-11-14 — End: 2020-11-14
  Administered 2020-11-14: 650 mg via ORAL

## 2020-11-14 MED ORDER — IRON SUCROSE 20 MG/ML IV SOLN
300.0000 mg | Freq: Once | INTRAVENOUS | Status: AC
Start: 2020-11-14 — End: 2020-11-14
  Administered 2020-11-14: 300 mg via INTRAVENOUS
  Filled 2020-11-14: qty 300

## 2020-11-14 MED ORDER — ACETAMINOPHEN 325 MG PO TABS
ORAL_TABLET | ORAL | Status: AC
  Filled 2020-11-14: qty 2

## 2020-11-14 MED ORDER — SODIUM CHLORIDE 0.9 % IV SOLN
Freq: Once | INTRAVENOUS | Status: AC
Start: 2020-11-14 — End: 2020-11-14
  Filled 2020-11-14: qty 250

## 2020-11-14 NOTE — Patient Instructions (Signed)
Iron Dextran injection What is this medication? IRON DEXTRAN (AHY ern DEX tran) is an iron complex. Iron is used to make healthy red blood cells, which carry oxygen and nutrients through the body. This medicine is used to treat people who cannot take iron by mouth and havelow levels of iron in the blood. This medicine may be used for other purposes; ask your health care provider orpharmacist if you have questions. COMMON BRAND NAME(S): Dexferrum, INFeD What should I tell my care team before I take this medication? They need to know if you have any of these conditions: anemia not caused by low iron levels heart disease high levels of iron in the blood kidney disease liver disease an unusual or allergic reaction to iron, other medicines, foods, dyes, or preservatives pregnant or trying to get pregnant breast-feeding How should I use this medication? This medicine is for injection into a vein or a muscle. It is given by a healthcare professional in a hospital or clinic setting. Talk to your pediatrician regarding the use of this medicine in children. While this drug may be prescribed for children as young as 60 months old for selectedconditions, precautions do apply. Overdosage: If you think you have taken too much of this medicine contact apoison control center or emergency room at once. NOTE: This medicine is only for you. Do not share this medicine with others. What if I miss a dose? It is important not to miss your dose. Call your doctor or health careprofessional if you are unable to keep an appointment. What may interact with this medication? Do not take this medicine with any of the following medications: deferoxamine dimercaprol other iron products This medicine may also interact with the following medications: chloramphenicol deferasirox This list may not describe all possible interactions. Give your health care provider a list of all the medicines, herbs, non-prescription drugs,  or dietary supplements you use. Also tell them if you smoke, drink alcohol, or use illegaldrugs. Some items may interact with your medicine. What should I watch for while using this medication? Visit your doctor or health care professional regularly. Tell your doctor if your symptoms do not start to get better or if they get worse. You may needblood work done while you are taking this medicine. You may need to follow a special diet. Talk to your doctor. Foods that contain iron include: whole grains/cereals, dried fruits, beans, or peas, leafy greenvegetables, and organ meats (liver, kidney). Long-term use of this medicine may increase your risk of some cancers. Talk toyour doctor about how to limit your risk. What side effects may I notice from receiving this medication? Side effects that you should report to your doctor or health care professionalas soon as possible: allergic reactions like skin rash, itching or hives, swelling of the face, lips, or tongue blue lips, nails, or skin breathing problems changes in blood pressure chest pain confusion fast, irregular heartbeat feeling faint or lightheaded, falls fever or chills flushing, sweating, or hot feelings joint or muscle aches or pains pain, tingling, numbness in the hands or feet seizures unusually weak or tired Side effects that usually do not require medical attention (report to yourdoctor or health care professional if they continue or are bothersome): change in taste (metallic taste) diarrhea headache irritation at site where injected nausea, vomiting stomach upset This list may not describe all possible side effects. Call your doctor for medical advice about side effects. You may report side effects to FDA at1-800-FDA-1088. Where should I keep my  medication? This drug is given in a hospital or clinic and will not be stored at home. NOTE: This sheet is a summary. It may not cover all possible information. If you have questions  about this medicine, talk to your doctor, pharmacist, orhealth care provider.  2022 Elsevier/Gold Standard (2007-09-12 16:59:50)

## 2020-11-21 ENCOUNTER — Inpatient Hospital Stay: Payer: No Typology Code available for payment source

## 2020-11-21 ENCOUNTER — Other Ambulatory Visit: Payer: Self-pay

## 2020-11-21 VITALS — BP 121/85 | HR 64 | Temp 98.8°F | Resp 18

## 2020-11-21 DIAGNOSIS — D500 Iron deficiency anemia secondary to blood loss (chronic): Secondary | ICD-10-CM | POA: Diagnosis not present

## 2020-11-21 DIAGNOSIS — C50212 Malignant neoplasm of upper-inner quadrant of left female breast: Secondary | ICD-10-CM

## 2020-11-21 MED ORDER — DIPHENHYDRAMINE HCL 25 MG PO CAPS
50.0000 mg | ORAL_CAPSULE | Freq: Once | ORAL | Status: AC
  Administered 2020-11-21: 50 mg via ORAL

## 2020-11-21 MED ORDER — SODIUM CHLORIDE 0.9 % IV SOLN
Freq: Once | INTRAVENOUS | Status: AC
  Filled 2020-11-21: qty 250

## 2020-11-21 MED ORDER — ACETAMINOPHEN 325 MG PO TABS
650.0000 mg | ORAL_TABLET | Freq: Once | ORAL | Status: AC
  Administered 2020-11-21: 650 mg via ORAL

## 2020-11-21 MED ORDER — ACETAMINOPHEN 325 MG PO TABS
ORAL_TABLET | ORAL | Status: AC
  Filled 2020-11-21: qty 2

## 2020-11-21 MED ORDER — SODIUM CHLORIDE 0.9 % IV SOLN
300.0000 mg | Freq: Once | INTRAVENOUS | Status: AC
  Administered 2020-11-21: 300 mg via INTRAVENOUS
  Filled 2020-11-21: qty 300

## 2020-11-21 MED ORDER — DIPHENHYDRAMINE HCL 25 MG PO CAPS
ORAL_CAPSULE | ORAL | Status: AC
  Filled 2020-11-21: qty 2

## 2020-11-21 NOTE — Patient Instructions (Signed)

## 2020-12-29 ENCOUNTER — Encounter: Payer: Self-pay | Admitting: Hematology and Oncology

## 2020-12-30 ENCOUNTER — Other Ambulatory Visit: Payer: Self-pay

## 2020-12-30 DIAGNOSIS — Z170 Estrogen receptor positive status [ER+]: Secondary | ICD-10-CM

## 2021-02-04 ENCOUNTER — Telehealth: Payer: Self-pay | Admitting: Gastroenterology

## 2021-02-04 MED ORDER — PANTOPRAZOLE SODIUM 40 MG PO TBEC: 40.0000 mg | DELAYED_RELEASE_TABLET | Freq: Every day | ORAL | 4 refills | Status: DC

## 2021-02-04 NOTE — Telephone Encounter (Signed)
Done

## 2021-02-04 NOTE — Telephone Encounter (Signed)
Inbound call from patient. Requesting medication refill for protonix. Sent to [facility] delivery for 90 days.

## 2021-03-10 ENCOUNTER — Other Ambulatory Visit: Payer: Self-pay

## 2021-03-10 ENCOUNTER — Encounter: Payer: Self-pay | Admitting: Hematology and Oncology

## 2021-03-10 DIAGNOSIS — R002 Palpitations: Secondary | ICD-10-CM

## 2021-03-11 ENCOUNTER — Other Ambulatory Visit: Payer: Self-pay

## 2021-03-11 ENCOUNTER — Other Ambulatory Visit: Payer: No Typology Code available for payment source

## 2021-03-11 ENCOUNTER — Emergency Department (HOSPITAL_COMMUNITY)
Admission: EM | Admit: 2021-03-11 | Discharge: 2021-03-12 | Disposition: A | Discharge status: Home / Self Care | Payer: No Typology Code available for payment source | Attending: Emergency Medicine | Admitting: Emergency Medicine

## 2021-03-11 ENCOUNTER — Encounter (HOSPITAL_COMMUNITY): Payer: Self-pay

## 2021-03-11 DIAGNOSIS — N939 Abnormal uterine and vaginal bleeding, unspecified: Secondary | ICD-10-CM | POA: Insufficient documentation

## 2021-03-11 DIAGNOSIS — N9489 Other specified conditions associated with female genital organs and menstrual cycle: Secondary | ICD-10-CM | POA: Insufficient documentation

## 2021-03-11 DIAGNOSIS — Z79899 Other long term (current) drug therapy: Secondary | ICD-10-CM | POA: Diagnosis not present

## 2021-03-11 DIAGNOSIS — I10 Essential (primary) hypertension: Secondary | ICD-10-CM | POA: Diagnosis not present

## 2021-03-11 DIAGNOSIS — Z853 Personal history of malignant neoplasm of breast: Secondary | ICD-10-CM | POA: Insufficient documentation

## 2021-03-11 DIAGNOSIS — E876 Hypokalemia: Secondary | ICD-10-CM | POA: Diagnosis not present

## 2021-03-11 LAB — CBC WITH DIFFERENTIAL/PLATELET
Abs Immature Granulocytes: 0.02 10*3/uL (ref 0.00–0.07)
Basophils Absolute: 0 10*3/uL (ref 0.0–0.1)
Basophils Relative: 0 %
Eosinophils Absolute: 0.2 10*3/uL (ref 0.0–0.5)
Eosinophils Relative: 2 %
HCT: 35.3 % — ABNORMAL LOW (ref 36.0–46.0)
Hemoglobin: 11.6 g/dL — ABNORMAL LOW (ref 12.0–15.0)
Immature Granulocytes: 0 %
Lymphocytes Relative: 29 %
Lymphs Abs: 2.5 10*3/uL (ref 0.7–4.0)
MCH: 27.8 pg (ref 26.0–34.0)
MCHC: 32.9 g/dL (ref 30.0–36.0)
MCV: 84.4 fL (ref 80.0–100.0)
Monocytes Absolute: 0.6 10*3/uL (ref 0.1–1.0)
Monocytes Relative: 7 %
Neutro Abs: 5.4 10*3/uL (ref 1.7–7.7)
Neutrophils Relative %: 62 %
Platelets: 265 10*3/uL (ref 150–400)
RBC: 4.18 MIL/uL (ref 3.87–5.11)
RDW: 14.6 % (ref 11.5–15.5)
WBC: 8.7 10*3/uL (ref 4.0–10.5)
nRBC: 0 % (ref 0.0–0.2)

## 2021-03-11 LAB — BASIC METABOLIC PANEL
Anion gap: 11 (ref 5–15)
BUN: 27 mg/dL — ABNORMAL HIGH (ref 6–20)
CO2: 26 mmol/L (ref 22–32)
Calcium: 9 mg/dL (ref 8.9–10.3)
Chloride: 99 mmol/L (ref 98–111)
Creatinine, Ser: 1.23 mg/dL — ABNORMAL HIGH (ref 0.44–1.00)
GFR, Estimated: 53 mL/min — ABNORMAL LOW (ref >60)
Glucose, Bld: 134 mg/dL — ABNORMAL HIGH (ref 70–99)
Potassium: 2.6 mmol/L — CL (ref 3.5–5.1)
Sodium: 136 mmol/L (ref 135–145)

## 2021-03-11 MED ORDER — POTASSIUM CHLORIDE CRYS ER 20 MEQ PO TBCR
40.0000 meq | EXTENDED_RELEASE_TABLET | Freq: Once | ORAL | Status: AC
  Administered 2021-03-11: 40 meq via ORAL
  Filled 2021-03-11: qty 2

## 2021-03-11 MED ORDER — POTASSIUM CHLORIDE 10 MEQ/100ML IV SOLN
10.0000 meq | Freq: Once | INTRAVENOUS | Status: AC
  Administered 2021-03-11: 10 meq via INTRAVENOUS
  Filled 2021-03-11: qty 100

## 2021-03-11 NOTE — ED Triage Notes (Signed)
Pt states she had a D&C in August and has not had a period since starting her cycle last Saturday 10/29. Pt states she has been bleeding nonstop since then and has been passing massive blood clots and her Gyn told her to come to ED.

## 2021-03-11 NOTE — ED Provider Notes (Signed)
[facility] DEPT Provider Note   CSN: [id number] Arrival date & time: 03/11/21  1848     History Chief Complaint  Patient presents with   Vaginal Bleeding    REINETTE CUNEO is a 55 y.o. female.   Vaginal Bleeding  Patient presents ED for evaluation of vaginal bleeding.  Patient states she had a D&C several months ago.  Patient has not had a menstrual period since this summer when she had the D&C.  Patient started having bleeding in the last few days.  It has been very heavy and today she started noting clots and tissue.  She called her OB/GYN doctor who suggested she come to the emergency room for evaluation.  Patient denies any lightheadedness.  No fevers or chills.  Past Medical History:  Diagnosis Date   Breast cancer, left ([place]) 02/27/2017   Depression    GERD (gastroesophageal reflux disease)    History of radiation therapy 03/08/17-04/23/17   left breast 50.4 Gy in 28 fractions, boost 10 Gy in 5 fractions   Hot flashes    Hypertension    Obesity     Patient Active Problem List   Diagnosis Date Noted   Iron deficiency anemia due to chronic blood loss 10/23/2020   Numbness of left foot 10/17/2018   Malignant neoplasm of upper-inner quadrant of left breast in female, estrogen receptor positive ([place]) 02/14/2017   Postoperative examination 02/07/2017   BMI 35.0-35.9,adult 01/04/2017   Chronic cough 04/28/2013    Past Surgical History:  Procedure Laterality Date   ABCESS DRAINAGE Left    left breast when patient was 21   BREAST LUMPECTOMY Left 01/25/2017   2 seperate spots   COLONOSCOPY  2020   COLONOSCOPY  09/25/2020   ESOPHAGOGASTRODUODENOSCOPY     2001 in [place]  09/25/2020     OB History   No obstetric history on file.     Family History  Problem Relation Age of Onset   Lupus Maternal Aunt    Lung cancer Paternal Grandfather    Diabetes Brother    Colon polyps Son    Colon  cancer Sister    Esophageal cancer Neg Hx    Rectal cancer Neg Hx    Stomach cancer Neg Hx     Social History   Tobacco Use   Smoking status: Never   Smokeless tobacco: Never  Vaping Use   Vaping Use: Never used  Substance Use Topics   Alcohol use: Yes    Comment: 1x per week, socially   Drug use: No    Home Medications Prior to Admission medications   Medication Sig Start Date End Date Taking? Authorizing Provider  potassium chloride SA (KLOR-CON) 20 MEQ tablet Take 1 tablet (20 mEq total) by mouth 2 (two) times daily for 5 days. 03/12/21 03/17/21 Yes Dorie Rank, MD  amLODipine (NORVASC) 5 MG tablet Take 5 mg by mouth daily.    [provider]  Biotin 5000 MCG CAPS Take by mouth daily.    [provider]  Calcium-Phosphorus-Vitamin D (CALCIUM GUMMIES PO) Take 1,000 mg by mouth daily.    [provider]  cetirizine (ZYRTEC) 10 MG tablet Take 10 mg by mouth.    [provider]  famotidine (PEPCID) 20 MG tablet Take 1 tablet (20 mg total) by mouth at bedtime as needed for heartburn or indigestion. Patient not taking: Reported on 09/25/2020 12/19/19   Jackquline Denmark, MD  hydrochlorothiazide (HYDRODIURIL) 25 MG  tablet Take 1 tablet (25 mg total) by mouth daily. 02/17/18   Nicholas Lose, MD  iron polysaccharides (NIFEREX) 150 MG capsule Take 150 mg by mouth daily. 09/03/20   [provider]  Multiple Vitamin (MULTIVITAMIN) tablet Take 1 tablet by mouth daily. Patient not taking: Reported on 09/25/2020    [provider]  pantoprazole (PROTONIX) 40 MG tablet Take 1 tablet (40 mg total) by mouth daily. 02/04/21   Jackquline Denmark, MD  tamoxifen (NOLVADEX) 20 MG tablet Take 1 tablet (20 mg total) by mouth daily. 10/23/20   Nicholas Lose, MD    Allergies    Erythromycin  Review of Systems   Review of Systems  Genitourinary:  Positive for vaginal bleeding.  All other systems reviewed and are negative.  Physical Exam Updated Vital Signs BP  122/85   Pulse 71   Temp 97.9 F (36.6 C) (Oral)   Resp 17   SpO2 97%   Physical Exam Vitals and nursing note reviewed.  Constitutional:      General: She is not in acute distress.    Appearance: She is well-developed.  HENT:     Head: Normocephalic and atraumatic.     Right Ear: External ear normal.     Left Ear: External ear normal.  Eyes:     General: No scleral icterus.       Right eye: No discharge.        Left eye: No discharge.     Conjunctiva/sclera: Conjunctivae normal.  Neck:     Trachea: No tracheal deviation.  Cardiovascular:     Rate and Rhythm: Normal rate and regular rhythm.  Pulmonary:     Effort: Pulmonary effort is normal. No respiratory distress.     Breath sounds: Normal breath sounds. No stridor. No wheezing or rales.  Abdominal:     General: Bowel sounds are normal. There is no distension.     Palpations: Abdomen is soft.     Tenderness: There is no abdominal tenderness. There is no guarding or rebound.  Genitourinary:    Pubic Area: No rash.      Labia:        Right: No rash.        Left: No rash.      Vagina: No tenderness.     Cervix: Cervical bleeding present.     Adnexa:        Right: No mass.       Comments: Friable tissue noted protruding from the cervical os, tissue removed with forceps Musculoskeletal:        General: No tenderness or deformity.     Cervical back: Neck supple.  Skin:    General: Skin is warm and dry.     Findings: No rash.  Neurological:     General: No focal deficit present.     Mental Status: She is alert.     Cranial Nerves: No cranial nerve deficit (no facial droop, extraocular movements intact, no slurred speech).     Sensory: No sensory deficit.     Motor: No abnormal muscle tone or seizure activity.     Coordination: Coordination normal.  Psychiatric:        Mood and Affect: Mood normal.    ED Results / Procedures / Treatments   Labs (all labs ordered are listed, but only abnormal results are  displayed) Labs Reviewed  CBC WITH DIFFERENTIAL/PLATELET - Abnormal; Notable for the following components:      Result Value   Hemoglobin  11.6 (*)    HCT 35.3 (*)    All other components within normal limits  BASIC METABOLIC PANEL - Abnormal; Notable for the following components:   Potassium 2.6 (*)    Glucose, Bld 134 (*)    BUN 27 (*)    Creatinine, Ser 1.23 (*)    GFR, Estimated 53 (*)    All other components within normal limits  I-STAT BETA HCG BLOOD, ED ([facility], [facility], [facility] ONLY)    EKG None  Radiology No results found.  Procedures Procedures   Medications Ordered in ED Medications  potassium chloride 10 mEq in 100 mL IVPB (10 mEq Intravenous New Bag/Given 03/11/21 2345)  potassium chloride SA (KLOR-CON) CR tablet 40 mEq (40 mEq Oral Given 03/11/21 2345)    ED Course  I have reviewed the triage vital signs and the nursing notes.  Pertinent labs & imaging results that were available during my care of the patient were reviewed by me and considered in my medical decision making (see chart for details).  Clinical Course as of 03/12/21 0020  Wed Mar 11, 2021  2350 Labs notable for hypokalemia.  Patient is on a diuretic. [JK]  2350 CBC shows a hemoglobin 11.6.  Patient states she previously was up to 13 [JK]    Clinical Course User Index [JK] Dorie Rank, MD   MDM Rules/Calculators/A&P                          Patient presented to the ED for evaluation of vaginal bleeding.  Patient has history of prior D&C.  She has endometrial thickening felt to be related to her tamoxifen.  Patient had heavy menstrual bleeding today and was noted be passing tissue.  On exam she did have evidence of tissue within the cervical os.  No heavy bleeding on exam at this time.  Hemoglobin has decreased from her baseline but no indication for transfusion.  Patient was also noted to be hypokalemic.  She is on a diuretic.  Patient was given IV potassium and I will discharge home with oral potassium.   Recommend close follow-up with her OB/GYN tomorrow.  Final Clinical Impression(s) / ED Diagnoses Final diagnoses:  Vaginal bleeding  Hypokalemia    Rx / DC Orders ED Discharge Orders          Ordered    potassium chloride SA (KLOR-CON) 20 MEQ tablet  2 times daily        03/12/21 Clayburn Pert, MD 03/12/21 0020

## 2021-03-11 NOTE — ED Provider Notes (Signed)
Emergency Medicine Provider Triage Evaluation Note  Tasha Thomas , a 55 y.o. female  was evaluated in triage.  Pt complains of excessive constant vaginal bleeding since Saturday.  Has a history of D&C secondary to polyps and fibroids.  Has been passing large clots and passed a "mass" earlier today.  He was feeling dizzy and lightheaded. No syncope.  Review of Systems  Positive:  Negative: See above   Physical Exam  BP (!) 136/96   Pulse 90   Temp 97.9 F (36.6 C) (Oral)   Resp 18   SpO2 99%  Gen:   Awake, no distress   Resp:  Normal effort  MSK:   Moves extremities without difficulty  Other:    Medical Decision Making  Medically screening exam initiated at 7:22 PM.  Appropriate orders placed.  Tasha Thomas was informed that the remainder of the evaluation will be completed by another provider, this initial triage assessment does not replace that evaluation, and the importance of remaining in the ED until their evaluation is complete.    Tasha Bright [place], PA-C 03/11/21 Tasha Thomas    Dorie Rank, MD 03/13/21 [phone]

## 2021-03-12 ENCOUNTER — Inpatient Hospital Stay: Payer: No Typology Code available for payment source | Attending: Hematology and Oncology

## 2021-03-12 DIAGNOSIS — R002 Palpitations: Secondary | ICD-10-CM

## 2021-03-12 DIAGNOSIS — D509 Iron deficiency anemia, unspecified: Secondary | ICD-10-CM | POA: Insufficient documentation

## 2021-03-12 DIAGNOSIS — C50212 Malignant neoplasm of upper-inner quadrant of left female breast: Secondary | ICD-10-CM | POA: Diagnosis not present

## 2021-03-12 DIAGNOSIS — Z170 Estrogen receptor positive status [ER+]: Secondary | ICD-10-CM | POA: Diagnosis not present

## 2021-03-12 DIAGNOSIS — Z79810 Long term (current) use of selective estrogen receptor modulators (SERMs): Secondary | ICD-10-CM | POA: Diagnosis not present

## 2021-03-12 LAB — CBC WITH DIFFERENTIAL (CANCER CENTER ONLY)
Abs Immature Granulocytes: 0.02 10*3/uL (ref 0.00–0.07)
Basophils Absolute: 0 10*3/uL (ref 0.0–0.1)
Basophils Relative: 0 %
Eosinophils Absolute: 0.2 10*3/uL (ref 0.0–0.5)
Eosinophils Relative: 4 %
HCT: 34.1 % — ABNORMAL LOW (ref 36.0–46.0)
Hemoglobin: 11.3 g/dL — ABNORMAL LOW (ref 12.0–15.0)
Immature Granulocytes: 0 %
Lymphocytes Relative: 32 %
Lymphs Abs: 1.7 10*3/uL (ref 0.7–4.0)
MCH: 27.4 pg (ref 26.0–34.0)
MCHC: 33.1 g/dL (ref 30.0–36.0)
MCV: 82.6 fL (ref 80.0–100.0)
Monocytes Absolute: 0.6 10*3/uL (ref 0.1–1.0)
Monocytes Relative: 11 %
Neutro Abs: 2.7 10*3/uL (ref 1.7–7.7)
Neutrophils Relative %: 53 %
Platelet Count: 254 10*3/uL (ref 150–400)
RBC: 4.13 MIL/uL (ref 3.87–5.11)
RDW: 14.3 % (ref 11.5–15.5)
WBC Count: 5.2 10*3/uL (ref 4.0–10.5)
nRBC: 0 % (ref 0.0–0.2)

## 2021-03-12 LAB — IRON AND TIBC
Iron: 58 ug/dL (ref 41–142)
Saturation Ratios: 19 % — ABNORMAL LOW (ref 21–57)
TIBC: 299 ug/dL (ref 236–444)
UIBC: 241 ug/dL (ref 120–384)

## 2021-03-12 LAB — HCG, QUANTITATIVE, PREGNANCY: hCG, Beta Chain, Quant, S: <1 m[IU]/mL (ref <5)

## 2021-03-12 LAB — FERRITIN: Ferritin: 117 ng/mL (ref 11–307)

## 2021-03-12 MED ORDER — POTASSIUM CHLORIDE CRYS ER 20 MEQ PO TBCR: 20.0000 meq | EXTENDED_RELEASE_TABLET | Freq: Two times a day (BID) | ORAL | 0 refills | Status: DC

## 2021-03-12 NOTE — Discharge Instructions (Signed)
Take the potassium medication as prescribed.  Follow-up with your primary doctor to have your potassium rechecked.  Follow-up with your [facility] doctor regarding your vaginal bleeding

## 2021-03-14 NOTE — Progress Notes (Incomplete)
Patient Care Team: Elinor Parkinson as PCP - General (Physician Assistant) Delice Bison Charlestine Massed, NP as Nurse Practitioner (Hematology and Oncology) Nicholas Lose, MD as Consulting Physician (Hematology and Oncology) Gery Pray, MD as Consulting Physician (Radiation Oncology) Marylu Lund., MD as Referring Physician (Surgery)  DIAGNOSIS: No diagnosis found.  SUMMARY OF ONCOLOGIC HISTORY: Oncology History  Malignant neoplasm of upper-inner quadrant of left breast in female, estrogen receptor positive ([place])  01/25/2017 Surgery   Left lumpectomy: IDC grade 1 and DCIS, 9 mmsize, margins negative, 0/2 lymph nodes negative, ER 95%, PR 100 %, HER-2 negative, Ki-67 10%T1b N0 stage IA   03/08/2017 - 04/20/2017 Radiation Therapy   Adjuvant radiation   05/2017 -  Anti-estrogen oral therapy   Tamoxifen daily     CHIEF COMPLIANT: Follow-up of left breast cancer  INTERVAL HISTORY: Tasha Thomas is a 55 y.o. with above-mentioned history of left breast cancer treated with lumpectomy, radiation, and who is currently on tamoxifen. She presents to the clinic today for follow-up.   ALLERGIES:  is allergic to erythromycin.  MEDICATIONS:  Current Outpatient Medications  Medication Sig Dispense Refill   amLODipine (NORVASC) 5 MG tablet Take 5 mg by mouth daily.     Biotin 5000 MCG CAPS Take by mouth daily.     Calcium-Phosphorus-Vitamin D (CALCIUM GUMMIES PO) Take 1,000 mg by mouth daily.     cetirizine (ZYRTEC) 10 MG tablet Take 10 mg by mouth.     famotidine (PEPCID) 20 MG tablet Take 1 tablet (20 mg total) by mouth at bedtime as needed for heartburn or indigestion. (Patient not taking: Reported on 09/25/2020) 30 tablet 3   hydrochlorothiazide (HYDRODIURIL) 25 MG tablet Take 1 tablet (25 mg total) by mouth daily.     iron polysaccharides (NIFEREX) 150 MG capsule Take 150 mg by mouth daily.     Multiple Vitamin (MULTIVITAMIN) tablet Take 1 tablet by mouth daily. (Patient not  taking: Reported on 09/25/2020)     pantoprazole (PROTONIX) 40 MG tablet Take 1 tablet (40 mg total) by mouth daily. 90 tablet 4   potassium chloride SA (KLOR-CON) 20 MEQ tablet Take 1 tablet (20 mEq total) by mouth 2 (two) times daily for 5 days. 10 tablet 0   tamoxifen (NOLVADEX) 20 MG tablet Take 1 tablet (20 mg total) by mouth daily. 90 tablet 3   No current facility-administered medications for this visit.    PHYSICAL EXAMINATION: ECOG PERFORMANCE STATUS: {CHL ONC ECOG PS:[id number]}  There were no vitals filed for this visit. There were no vitals filed for this visit.  BREAST:*** No palpable masses or nodules in either right or left breasts. No palpable axillary supraclavicular or infraclavicular adenopathy no breast tenderness or nipple discharge. (exam performed in the presence of a chaperone)  LABORATORY DATA:  I have reviewed the data as listed CMP Latest Ref Rng & Units 03/11/2021 10/05/2020  Glucose 70 - 99 mg/dL 134(H) 104(H)  BUN 6 - 20 mg/dL 27(H) 16  Creatinine 0.44 - 1.00 mg/dL 1.23(H) 0.75  Sodium 135 - 145 mmol/L 136 137  Potassium 3.5 - 5.1 mmol/L 2.6(LL) 3.2(L)  Chloride 98 - 111 mmol/L 99 104  CO2 22 - 32 mmol/L 26 20(L)  Calcium 8.9 - 10.3 mg/dL 9.0 8.8(L)  Total Protein 6.5 - 8.1 g/dL - 7.3  Total Bilirubin 0.3 - 1.2 mg/dL - 0.4  Alkaline Phos 38 - 126 U/L - 53  AST 15 - 41 U/L - 22  ALT 0 - 44 U/L -  17    Lab Results  Component Value Date   WBC 5.2 03/12/2021   HGB 11.3 (L) 03/12/2021   HCT 34.1 (L) 03/12/2021   MCV 82.6 03/12/2021   PLT 254 03/12/2021   NEUTROABS 2.7 03/12/2021    ASSESSMENT & PLAN:  No problem-specific Assessment & Plan notes found for this encounter.    No orders of the defined types were placed in this encounter.  The patient has a good understanding of the overall plan. she agrees with it. she will call with any problems that may develop before the next visit here.  Total time spent: *** mins including face to face time  and time spent for planning, charting and coordination of care  Rulon Eisenmenger, MD, MPH 03/14/2021  I, Thana Ates, am acting as scribe for Dr. Nicholas Lose.  {insert scribe attestation}

## 2021-03-16 ENCOUNTER — Inpatient Hospital Stay: Payer: No Typology Code available for payment source | Admitting: Hematology and Oncology

## 2021-03-16 NOTE — Assessment & Plan Note (Deleted)
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 Adjuvant radiation therapy10/30/2018-04/20/2017 2. Adjuvant antiestrogen therapy with tamoxifen 20 mg daily 10 years versus switching her after she becomes menopausal to anastrozole.Started January 2019  Tamoxifen toxicities:Night sweats:Continues to be a problem but she is managing it. Palpable breast lump: It was felt to be benign by mammogram and ultrasound. With clinical examination it feels normal breast architectural change.  Breast cancer surveillance: 1.Breast exam6/16/2022: Benign 2.mammograms6/12/22 at [facility]: Benign  CT abd and pelvis: 10/05/20: Thickened endometrium, mod left renal parenchymal atrophyureterocele, fatty liver

## 2021-03-16 NOTE — Assessment & Plan Note (Deleted)
Most likely related to heavy menstrual bleeding.   Lab review: 10/05/2020: Hemoglobin 8.8, MCV 70.9, ferritin 03/12/2021: Hemoglobin 11.3, MCV 82.6, iron saturation 19%, ferritin 117  Excellent response to IV iron.  We can watch and monitor.

## 2021-03-23 ENCOUNTER — Encounter: Payer: Self-pay | Admitting: Hematology and Oncology

## 2021-04-24 ENCOUNTER — Other Ambulatory Visit: Payer: Self-pay

## 2021-04-24 ENCOUNTER — Inpatient Hospital Stay: Payer: No Typology Code available for payment source | Attending: Hematology and Oncology

## 2021-04-24 DIAGNOSIS — Z170 Estrogen receptor positive status [ER+]: Secondary | ICD-10-CM | POA: Diagnosis not present

## 2021-04-24 DIAGNOSIS — Z79810 Long term (current) use of selective estrogen receptor modulators (SERMs): Secondary | ICD-10-CM | POA: Diagnosis not present

## 2021-04-24 DIAGNOSIS — N920 Excessive and frequent menstruation with regular cycle: Secondary | ICD-10-CM | POA: Insufficient documentation

## 2021-04-24 DIAGNOSIS — C50212 Malignant neoplasm of upper-inner quadrant of left female breast: Secondary | ICD-10-CM | POA: Diagnosis not present

## 2021-04-24 DIAGNOSIS — D500 Iron deficiency anemia secondary to blood loss (chronic): Secondary | ICD-10-CM | POA: Diagnosis not present

## 2021-04-24 LAB — IRON AND TIBC
Iron: 32 ug/dL — ABNORMAL LOW (ref 41–142)
Saturation Ratios: 8 % — ABNORMAL LOW (ref 21–57)
TIBC: 388 ug/dL (ref 236–444)
UIBC: 356 ug/dL (ref 120–384)

## 2021-04-24 LAB — CBC WITH DIFFERENTIAL (CANCER CENTER ONLY)
Abs Immature Granulocytes: 0.01 10*3/uL (ref 0.00–0.07)
Basophils Absolute: 0 10*3/uL (ref 0.0–0.1)
Basophils Relative: 0 %
Eosinophils Absolute: 0.1 10*3/uL (ref 0.0–0.5)
Eosinophils Relative: 1 %
HCT: 35.4 % — ABNORMAL LOW (ref 36.0–46.0)
Hemoglobin: 11.2 g/dL — ABNORMAL LOW (ref 12.0–15.0)
Immature Granulocytes: 0 %
Lymphocytes Relative: 37 %
Lymphs Abs: 2.1 10*3/uL (ref 0.7–4.0)
MCH: 26.5 pg (ref 26.0–34.0)
MCHC: 31.6 g/dL (ref 30.0–36.0)
MCV: 83.7 fL (ref 80.0–100.0)
Monocytes Absolute: 0.5 10*3/uL (ref 0.1–1.0)
Monocytes Relative: 8 %
Neutro Abs: 3 10*3/uL (ref 1.7–7.7)
Neutrophils Relative %: 54 %
Platelet Count: 275 10*3/uL (ref 150–400)
RBC: 4.23 MIL/uL (ref 3.87–5.11)
RDW: 14.8 % (ref 11.5–15.5)
WBC Count: 5.7 10*3/uL (ref 4.0–10.5)
nRBC: 0 % (ref 0.0–0.2)

## 2021-04-24 LAB — FERRITIN: Ferritin: 7 ng/mL — ABNORMAL LOW (ref 11–307)

## 2021-04-26 NOTE — Assessment & Plan Note (Signed)
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 Adjuvant radiation therapy10/30/2018-04/20/2017 2. Adjuvant antiestrogen therapy with tamoxifen 20 mg daily 10 years versus switching her after she becomes menopausal to anastrozole.Started January 2019  Tamoxifen toxicities:Night sweats:Continues to be a problem but she is managing it. Palpable breast lump: It was felt to be benign by mammogram and ultrasound. With clinical examination it feels normal breast architectural change.  Breast cancer surveillance: 1.Breast exam12/19/2022: Benign 2.m[facility]6/12/22 at [facility]: Benign  CT abd and pelvis: 10/05/20: Thickened endometrium, mod left renal parenchymal atrophyureterocele, fatty liver

## 2021-04-26 NOTE — Progress Notes (Signed)
HEMATOLOGY-ONCOLOGY TELEPHONE VISIT PROGRESS NOTE  I connected with Tasha Thomas on 04/27/2021 at  8:30 AM EST by telephone and verified that I am speaking with the correct person using two identifiers.  I discussed the limitations, risks, security and privacy concerns of performing an evaluation and management service by telephone and the availability of in person appointments.  I also discussed with the patient that there may be a patient responsible charge related to this service. The patient expressed understanding and agreed to proceed.   History of Present Illness: Tasha Thomas is a 55 y.o. female with above-mentioned history of left breast cancer treated with lumpectomy, radiation, and who is currently on tamoxifen. She presents via telephone today for follow-up.  She had a D&C performed in August 2022 and had a lot of bleeding at that time but not had any further problems since then.  She does not have any fatigue dizziness lightheadedness or cravings for ice chips at this time.  She does not take any oral iron therapy either.  Oncology History  Malignant neoplasm of upper-inner quadrant of left breast in female, estrogen receptor positive ([place])  01/25/2017 Surgery   Left lumpectomy: IDC grade 1 and DCIS, 9 mmsize, margins negative, 0/2 lymph nodes negative, ER 95%, PR 100 %, HER-2 negative, Ki-67 10%T1b N0 stage IA   03/08/2017 - 04/20/2017 Radiation Therapy   Adjuvant radiation   05/2017 -  Anti-estrogen oral therapy   Tamoxifen daily        Assessment Plan:  Malignant neoplasm of upper-inner quadrant of left breast in female, estrogen receptor positive ([place]) 01/26/17: Left lumpectomy: IDC grade 1 and DCIS, 9 mm size, margins negative, 0/2 lymph nodes negative, ER 95%, PR 100 %, HER-2 negative, Ki-67 10%T1b N0 stage IA   Treatment summary: 1. Adjuvant radiation therapy 03/08/2017-04/20/2017 2. Adjuvant antiestrogen therapy with tamoxifen 20 mg daily 10 years versus  switching her after she becomes menopausal to anastrozole.  Started January 2019 (reducing the dosage to 10 mg daily on 04/27/2021 because of her uterine polyps)   Tamoxifen toxicities:   Night sweats: Continues to be a problem but she is managing it. Uterine hypertrophy/polyps: [facility] August 2022: I encouraged her to reduce the dosage of tamoxifen to 10 mg daily      Breast cancer surveillance: 1.  Breast exam 04/27/2021: Benign 2. mammograms 10/19/20 at [facility]: Benign   CT abd and pelvis: 10/05/20: Thickened endometrium, mod left renal parenchymal atrophyureterocele, fatty liver  Iron deficiency anemia due to chronic blood loss Most likely related to heavy menstrual bleeding. (D and C Aug)    IV Iron: 3 doses of IV Venofer 11/04/20 Her menstrual cycles have stopped over the past month and she is hoping that she is going to go into menopause soon.  Lab Review: 04/24/21: Iron Sat 8%, TIBC 388, Ferritin: 7 (was 117), Hb 11.2, MCV 83.7 Although the ferritin is low she is no longer bleeding and therefore we decided to watch and monitor.  Return to clinic in 1 year with labs done ahead of time and follow-up.   I discussed the assessment and treatment plan with the patient. The patient was provided an opportunity to ask questions and all were answered. The patient agreed with the plan and demonstrated an understanding of the instructions. The patient was advised to call back or seek an in-person evaluation if the symptoms worsen or if the condition fails to improve as anticipated.   Total time spent: 15 mins including non-face to  face time and time spent for planning, charting and coordination of care  Rulon Eisenmenger, MD 04/27/2021    I, Thana Ates, am acting as scribe for Nicholas Lose, MD.  I have reviewed the above documentation for accuracy and completeness, and I agree with the above.

## 2021-04-26 NOTE — Assessment & Plan Note (Signed)
Most likely related to heavy menstrual bleeding. She has been on oral iron therapy for the past 2 to 3 months. She feels extremely tired and dizzy when she bends forward and has profound ice chip cravings. Most recent hemoglobin: 9.4 (improved from 7) Ferritin was 5  IV Iron: 3 doses of IV Venofer 11/04/20 Her menstrual cycles have stopped over the past month and she is hoping that she is going to go into menopause soon.  Lab Review: 04/24/21: Iron Sat 8%, TIBC 388, Ferritin: 7 (was 117), Hb 11.2, MCV 83.7

## 2021-04-27 ENCOUNTER — Inpatient Hospital Stay (HOSPITAL_BASED_OUTPATIENT_CLINIC_OR_DEPARTMENT_OTHER): Payer: No Typology Code available for payment source | Admitting: Hematology and Oncology

## 2021-04-27 DIAGNOSIS — Z170 Estrogen receptor positive status [ER+]: Secondary | ICD-10-CM | POA: Diagnosis not present

## 2021-04-27 DIAGNOSIS — D500 Iron deficiency anemia secondary to blood loss (chronic): Secondary | ICD-10-CM | POA: Diagnosis not present

## 2021-04-27 DIAGNOSIS — C50212 Malignant neoplasm of upper-inner quadrant of left female breast: Secondary | ICD-10-CM

## 2021-08-21 ENCOUNTER — Other Ambulatory Visit: Payer: Self-pay | Admitting: *Deleted

## 2021-08-21 ENCOUNTER — Encounter: Payer: Self-pay | Admitting: Hematology and Oncology

## 2021-08-21 DIAGNOSIS — Z170 Estrogen receptor positive status [ER+]: Secondary | ICD-10-CM

## 2021-08-24 ENCOUNTER — Telehealth: Payer: Self-pay | Admitting: Hematology and Oncology

## 2021-08-24 NOTE — Telephone Encounter (Signed)
Per 4/17 in basket called pt and spoke to pt .  Pt confirmed appointment  ?

## 2021-09-04 ENCOUNTER — Other Ambulatory Visit: Payer: Self-pay

## 2021-09-04 ENCOUNTER — Inpatient Hospital Stay: Payer: No Typology Code available for payment source | Attending: Hematology and Oncology

## 2021-09-04 ENCOUNTER — Other Ambulatory Visit: Payer: Self-pay | Admitting: *Deleted

## 2021-09-04 DIAGNOSIS — Z79810 Long term (current) use of selective estrogen receptor modulators (SERMs): Secondary | ICD-10-CM | POA: Insufficient documentation

## 2021-09-04 DIAGNOSIS — C50212 Malignant neoplasm of upper-inner quadrant of left female breast: Secondary | ICD-10-CM | POA: Insufficient documentation

## 2021-09-04 DIAGNOSIS — D500 Iron deficiency anemia secondary to blood loss (chronic): Secondary | ICD-10-CM

## 2021-09-04 DIAGNOSIS — Z170 Estrogen receptor positive status [ER+]: Secondary | ICD-10-CM | POA: Diagnosis not present

## 2021-09-04 LAB — CBC WITH DIFFERENTIAL (CANCER CENTER ONLY)
Abs Immature Granulocytes: 0.02 10*3/uL (ref 0.00–0.07)
Basophils Absolute: 0 10*3/uL (ref 0.0–0.1)
Basophils Relative: 0 %
Eosinophils Absolute: 0.1 10*3/uL (ref 0.0–0.5)
Eosinophils Relative: 1 %
HCT: 33.9 % — ABNORMAL LOW (ref 36.0–46.0)
Hemoglobin: 10.2 g/dL — ABNORMAL LOW (ref 12.0–15.0)
Immature Granulocytes: 0 %
Lymphocytes Relative: 39 %
Lymphs Abs: 2.5 10*3/uL (ref 0.7–4.0)
MCH: 22.6 pg — ABNORMAL LOW (ref 26.0–34.0)
MCHC: 30.1 g/dL (ref 30.0–36.0)
MCV: 75 fL — ABNORMAL LOW (ref 80.0–100.0)
Monocytes Absolute: 0.5 10*3/uL (ref 0.1–1.0)
Monocytes Relative: 8 %
Neutro Abs: 3.3 10*3/uL (ref 1.7–7.7)
Neutrophils Relative %: 52 %
Platelet Count: 289 10*3/uL (ref 150–400)
RBC: 4.52 MIL/uL (ref 3.87–5.11)
RDW: 16.9 % — ABNORMAL HIGH (ref 11.5–15.5)
WBC Count: 6.4 10*3/uL (ref 4.0–10.5)
nRBC: 0 % (ref 0.0–0.2)

## 2021-09-04 LAB — CMP (CANCER CENTER ONLY)
ALT: 16 U/L (ref 0–44)
AST: 18 U/L (ref 15–41)
Albumin: 4.1 g/dL (ref 3.5–5.0)
Alkaline Phosphatase: 67 U/L (ref 38–126)
Anion gap: 10 (ref 5–15)
BUN: 19 mg/dL (ref 6–20)
CO2: 25 mmol/L (ref 22–32)
Calcium: 9.3 mg/dL (ref 8.9–10.3)
Chloride: 104 mmol/L (ref 98–111)
Creatinine: 0.79 mg/dL (ref 0.44–1.00)
GFR, Estimated: >60 mL/min (ref >60)
Glucose, Bld: 104 mg/dL — ABNORMAL HIGH (ref 70–99)
Potassium: 3.6 mmol/L (ref 3.5–5.1)
Sodium: 139 mmol/L (ref 135–145)
Total Bilirubin: 0.3 mg/dL (ref 0.3–1.2)
Total Protein: 7.4 g/dL (ref 6.5–8.1)

## 2021-09-04 LAB — IRON AND IRON BINDING CAPACITY (CC-WL,HP ONLY)
Iron: 35 ug/dL (ref 28–170)
Saturation Ratios: 7 % — ABNORMAL LOW (ref 10.4–31.8)
TIBC: 521 ug/dL — ABNORMAL HIGH (ref 250–450)
UIBC: 486 ug/dL — ABNORMAL HIGH (ref 148–442)

## 2021-09-04 LAB — FERRITIN: Ferritin: 4 ng/mL — ABNORMAL LOW (ref 11–307)

## 2021-09-04 LAB — VITAMIN B12: Vitamin B-12: 361 pg/mL (ref 180–914)

## 2021-09-07 ENCOUNTER — Inpatient Hospital Stay: Payer: No Typology Code available for payment source | Attending: Hematology and Oncology | Admitting: Adult Health

## 2021-09-07 ENCOUNTER — Other Ambulatory Visit: Payer: Self-pay

## 2021-09-07 ENCOUNTER — Encounter: Payer: Self-pay | Admitting: Adult Health

## 2021-09-07 VITALS — BP 132/86 | HR 82 | Temp 97.9°F | Resp 16 | Ht 64.0 in | Wt 216.2 lb

## 2021-09-07 DIAGNOSIS — Z170 Estrogen receptor positive status [ER+]: Secondary | ICD-10-CM | POA: Insufficient documentation

## 2021-09-07 DIAGNOSIS — D500 Iron deficiency anemia secondary to blood loss (chronic): Secondary | ICD-10-CM | POA: Diagnosis present

## 2021-09-07 DIAGNOSIS — C50212 Malignant neoplasm of upper-inner quadrant of left female breast: Secondary | ICD-10-CM | POA: Insufficient documentation

## 2021-09-07 DIAGNOSIS — Z79811 Long term (current) use of aromatase inhibitors: Secondary | ICD-10-CM | POA: Insufficient documentation

## 2021-09-07 DIAGNOSIS — N920 Excessive and frequent menstruation with regular cycle: Secondary | ICD-10-CM | POA: Insufficient documentation

## 2021-09-07 NOTE — Progress Notes (Signed)
[facility] Cancer Follow up:    Tasha Canary, PA-C [address] [place] [address]   DIAGNOSIS:  Cancer Staging  Malignant neoplasm of upper-inner quadrant of left breast in female, estrogen receptor positive ([facility]) Staging form: Breast, AJCC 8th Edition - Clinical: Stage IA (cT1b, cN0, cM0, G1, ER+, PR+, HER2-) - Unsigned Histologic grading system: 3 grade system - Pathologic: Stage IA (pT1b, pN0, cM0, G1, ER+, PR+, HER2-) - Unsigned Histologic grading system: 3 grade system   SUMMARY OF ONCOLOGIC HISTORY: Oncology History  Malignant neoplasm of upper-inner quadrant of left breast in female, estrogen receptor positive ([facility])  01/25/2017 Surgery   Left lumpectomy: IDC grade 1 and DCIS, 9 mmsize, margins negative, 0/2 lymph nodes negative, ER 95%, PR 100 %, HER-2 negative, Ki-67 10%T1b N0 stage IA    03/08/2017 - 04/20/2017 Radiation Therapy   Adjuvant radiation    05/2017 -  Anti-estrogen oral therapy   Tamoxifen daily      CURRENT THERAPY: Tamoxifen daily, iron deficiency  INTERVAL HISTORY: Tasha Thomas 56 y.o. female returns for follow-up of her iron deficiency.  She has noticed progressive fatigue and chewing of ice over the past couple of weeks.  Her ferritin that was drawn a few days ago was 5.  Last year she underwent a full iron deficiency work-up with colonoscopy referral upper GI and also GYN.  She did undergo a D&C and her.'s are not as heavy however she still has heavy cycles, they are just shorter in duration.  She has received IV Venofer before with good tolerance.   Patient Active Problem List   Diagnosis Date Noted   Iron deficiency anemia due to chronic blood loss 10/23/2020   Numbness of left foot 10/17/2018   Malignant neoplasm of upper-inner quadrant of left breast in female, estrogen receptor positive (HCC) 02/14/2017   Postoperative examination 02/07/2017   BMI 35.0-35.9,adult 01/04/2017   Chronic cough  04/28/2013    is allergic to erythromycin.  MEDICAL HISTORY: Past Medical History:  Diagnosis Date   Breast cancer, left (HCC) 02/27/2017   Depression    GERD (gastroesophageal reflux disease)    History of radiation therapy 03/08/17-04/23/17   left breast 50.4 Gy in 28 fractions, boost 10 Gy in 5 fractions   Hot flashes    Hypertension    Obesity     SURGICAL HISTORY: Past Surgical History:  Procedure Laterality Date   ABCESS DRAINAGE Left    left breast when patient was 21   BREAST LUMPECTOMY Left 01/25/2017   2 seperate spots   COLONOSCOPY  2020   COLONOSCOPY  09/25/2020   ESOPHAGOGASTRODUODENOSCOPY     2001 in [place]   UPPER GASTROINTESTINAL ENDOSCOPY  09/25/2020    SOCIAL HISTORY: Social History   Socioeconomic History   Marital status: Married    Spouse name: Not on file   Number of children: 3   Years of education: Not on file   Highest education level: Not on file  Occupational History   Occupation: Event organiser: [employer]  Tobacco Use   Smoking status: Never   Smokeless tobacco: Never  Vaping Use   Vaping Use: Never used  Substance and Sexual Activity   Alcohol use: Yes    Comment: 1x per week, socially   Drug use: No   Sexual activity: Not on file  Other Topics Concern   Not on file  Social History Narrative   Not on file  Social Determinants of Health   Financial Resource Strain: Not on file  Food Insecurity: Not on file  Transportation Needs: Not on file  Physical Activity: Not on file  Stress: Not on file  Social Connections: Not on file  Intimate Partner Violence: Not on file    FAMILY HISTORY: Family History  Problem Relation Age of Onset   Lupus Maternal Aunt    Lung cancer Paternal Grandfather    Diabetes Brother    Colon polyps Son    Colon cancer Sister    Esophageal cancer Neg Hx    Rectal cancer Neg Hx    Stomach cancer Neg Hx     Review of Systems  Constitutional:  Positive for fatigue. Negative for  appetite change, chills, fever and unexpected weight change.  HENT:   Negative for hearing loss, lump/mass and trouble swallowing.   Eyes:  Negative for eye problems and icterus.  Respiratory:  Negative for chest tightness, cough and shortness of breath.   Cardiovascular:  Negative for chest pain, leg swelling and palpitations.  Gastrointestinal:  Negative for abdominal distention, abdominal pain, constipation, diarrhea, nausea and vomiting.  Endocrine: Negative for hot flashes.  Genitourinary:  Negative for difficulty urinating.   Musculoskeletal:  Negative for arthralgias.  Skin:  Negative for itching and rash.  Neurological:  Negative for dizziness, extremity weakness, headaches and numbness.  Hematological:  Negative for adenopathy. Does not bruise/bleed easily.  Psychiatric/Behavioral:  Negative for depression. The patient is not nervous/anxious.      PHYSICAL EXAMINATION  ECOG PERFORMANCE STATUS: 1 - Symptomatic but completely ambulatory  Vitals:   09/07/21 1120  BP: 132/86  Pulse: 82  Resp: 16  Temp: 97.9 F (36.6 C)  SpO2: 100%    Physical Exam Constitutional:      General: She is not in acute distress.    Appearance: Normal appearance. She is not toxic-appearing.  HENT:     Head: Normocephalic and atraumatic.  Eyes:     General: No scleral icterus. Cardiovascular:     Rate and Rhythm: Normal rate and regular rhythm.     Pulses: Normal pulses.     Heart sounds: Normal heart sounds.  Pulmonary:     Effort: Pulmonary effort is normal.     Breath sounds: Normal breath sounds.  Abdominal:     General: Abdomen is flat. Bowel sounds are normal. There is no distension.     Palpations: Abdomen is soft.     Tenderness: There is no abdominal tenderness.  Musculoskeletal:        General: No swelling.     Cervical back: Neck supple.  Lymphadenopathy:     Cervical: No cervical adenopathy.  Skin:    General: Skin is warm and dry.     Findings: No rash.  Neurological:      General: No focal deficit present.     Mental Status: She is alert.  Psychiatric:        Mood and Affect: Mood normal.        Behavior: Behavior normal.    LABORATORY DATA:  CBC    Component Value Date/Time   WBC 6.4 09/04/2021 1510   WBC 8.7 03/11/2021 1933   RBC 4.52 09/04/2021 1510   HGB 10.2 (L) 09/04/2021 1510   HCT 33.9 (L) 09/04/2021 1510   PLT 289 09/04/2021 1510   MCV 75.0 (L) 09/04/2021 1510   MCH 22.6 (L) 09/04/2021 1510   MCHC 30.1 09/04/2021 1510   RDW 16.9 (H) 09/04/2021 1510  LYMPHSABS 2.5 09/04/2021 1510   MONOABS 0.5 09/04/2021 1510   EOSABS 0.1 09/04/2021 1510   BASOSABS 0.0 09/04/2021 1510    CMP     Component Value Date/Time   NA 139 09/04/2021 1510   K 3.6 09/04/2021 1510   CL 104 09/04/2021 1510   CO2 25 09/04/2021 1510   GLUCOSE 104 (H) 09/04/2021 1510   BUN 19 09/04/2021 1510   CREATININE 0.79 09/04/2021 1510   CALCIUM 9.3 09/04/2021 1510   PROT 7.4 09/04/2021 1510   ALBUMIN 4.1 09/04/2021 1510   AST 18 09/04/2021 1510   ALT 16 09/04/2021 1510   ALKPHOS 67 09/04/2021 1510   BILITOT 0.3 09/04/2021 1510   GFRNONAA >60 09/04/2021 1510      ASSESSMENT and THERAPY PLAN:   Iron deficiency anemia due to chronic blood loss This is a 56 year old with iron deficiency anemia secondary to menorrhagia.  She will continue to see gynecology regularly.  She does need IV iron and I placed orders for this with Venofer weekly x3.  She will return next week to get started.  We will see Kripa back in approximately 8 weeks for lab check and follow-up with myself or Dr. Pamelia Hoit approximately 1 week after.  I am hopeful that moving forward will be able to catch her iron deficiency for it becomes so severe that she has significant symptoms.   All questions were answered. The patient knows to call the clinic with any problems, questions or concerns. We can certainly see the patient much sooner if necessary.  Total encounter time:20 minutes*in  face-to-face visit time, chart review, lab review, care coordination, order entry, and documentation of the encounter time.  Lillard Anes, NP 09/07/21 1:57 PM Medical Oncology and Hematology [facility] [address] [place], [place] [address] Tel. [phone]    Fax. [phone]  *Total Encounter Time as defined by the Centers for Medicare and Medicaid Services includes, in addition to the face-to-face time of a patient visit (documented in the note above) non-face-to-face time: obtaining and reviewing outside history, ordering and reviewing medications, tests or procedures, care coordination (communications with other health care professionals or caregivers) and documentation in the medical record.

## 2021-09-07 NOTE — Assessment & Plan Note (Signed)
This is a 56 year old with iron deficiency anemia secondary to menorrhagia.  She will continue to see gynecology regularly.  She does need IV iron and I placed orders for this with Venofer weekly x3.  She will return next week to get started. ? ?We will see Tasha Thomas back in approximately 8 weeks for lab check and follow-up with myself or Dr. Lindi Adie approximately 1 week after.  I am hopeful that moving forward will be able to catch her iron deficiency for it becomes so severe that she has significant symptoms. ? ?

## 2021-09-14 ENCOUNTER — Other Ambulatory Visit: Payer: Self-pay

## 2021-09-14 ENCOUNTER — Inpatient Hospital Stay: Payer: No Typology Code available for payment source

## 2021-09-14 VITALS — BP 114/78 | HR 78 | Temp 98.2°F | Resp 17

## 2021-09-14 DIAGNOSIS — D500 Iron deficiency anemia secondary to blood loss (chronic): Secondary | ICD-10-CM | POA: Diagnosis not present

## 2021-09-14 MED ORDER — SODIUM CHLORIDE 0.9 % IV SOLN
300.0000 mg | Freq: Once | INTRAVENOUS | Status: AC
  Administered 2021-09-14: 300 mg via INTRAVENOUS
  Filled 2021-09-14: qty 300

## 2021-09-14 MED ORDER — SODIUM CHLORIDE 0.9 % IV SOLN: Freq: Once | INTRAVENOUS | Status: AC

## 2021-09-14 NOTE — Patient Instructions (Signed)

## 2021-09-21 ENCOUNTER — Inpatient Hospital Stay: Payer: No Typology Code available for payment source

## 2021-09-21 ENCOUNTER — Other Ambulatory Visit: Payer: Self-pay

## 2021-09-21 VITALS — BP 123/83 | HR 76 | Temp 98.4°F | Resp 17

## 2021-09-21 DIAGNOSIS — D500 Iron deficiency anemia secondary to blood loss (chronic): Secondary | ICD-10-CM | POA: Diagnosis not present

## 2021-09-21 MED ORDER — SODIUM CHLORIDE 0.9 % IV SOLN
300.0000 mg | Freq: Once | INTRAVENOUS | Status: AC
  Administered 2021-09-21: 300 mg via INTRAVENOUS
  Filled 2021-09-21: qty 300

## 2021-09-21 MED ORDER — SODIUM CHLORIDE 0.9 % IV SOLN: Freq: Once | INTRAVENOUS | Status: AC

## 2021-09-28 ENCOUNTER — Other Ambulatory Visit: Payer: Self-pay

## 2021-09-28 ENCOUNTER — Inpatient Hospital Stay: Payer: No Typology Code available for payment source

## 2021-09-28 VITALS — BP 124/91 | HR 83 | Temp 98.6°F | Resp 18

## 2021-09-28 DIAGNOSIS — D500 Iron deficiency anemia secondary to blood loss (chronic): Secondary | ICD-10-CM

## 2021-09-28 MED ORDER — SODIUM CHLORIDE 0.9 % IV SOLN: Freq: Once | INTRAVENOUS | Status: AC

## 2021-09-28 MED ORDER — SODIUM CHLORIDE 0.9 % IV SOLN
300.0000 mg | Freq: Once | INTRAVENOUS | Status: AC
  Administered 2021-09-28: 300 mg via INTRAVENOUS
  Filled 2021-09-28: qty 300

## 2021-09-28 NOTE — Patient Instructions (Signed)

## 2021-09-28 NOTE — Progress Notes (Signed)
Pt declined to stay the 30 minute post infusion observation period. VSS upon discharge.

## 2021-10-01 ENCOUNTER — Other Ambulatory Visit: Payer: Self-pay | Admitting: Gastroenterology

## 2021-10-23 ENCOUNTER — Encounter: Payer: Self-pay | Admitting: Hematology and Oncology

## 2021-11-02 NOTE — Progress Notes (Signed)
Patient Care Team: Elinor Parkinson as PCP - General (Physician Assistant) Delice Bison Charlestine Massed, NP as Nurse Practitioner (Hematology and Oncology) Nicholas Lose, MD as Consulting Physician (Hematology and Oncology) Gery Pray, MD as Consulting Physician (Radiation Oncology) Marylu Lund., MD as Referring Physician (Surgery)  DIAGNOSIS:  Encounter Diagnoses  Name Primary?   Malignant neoplasm of upper-inner quadrant of left breast in female, estrogen receptor positive ([place])    Iron deficiency anemia due to chronic blood loss     SUMMARY OF ONCOLOGIC HISTORY: Oncology History  Malignant neoplasm of upper-inner quadrant of left breast in female, estrogen receptor positive ([place])  01/25/2017 Surgery   Left lumpectomy: IDC grade 1 and DCIS, 9 mmsize, margins negative, 0/2 lymph nodes negative, ER 95%, PR 100 %, HER-2 negative, Ki-67 10%T1b N0 stage IA   03/08/2017 - 04/20/2017 Radiation Therapy   Adjuvant radiation   05/2017 -  Anti-estrogen oral therapy   Tamoxifen daily     CHIEF COMPLIANT: Follow-up on tamoxifen.  INTERVAL HISTORY: Tasha Thomas is a  56 y.o. with above-mentioned history of left breast cancer who is currently on tamoxifen. She presents to the clinic today for a follow-up. She states that she still having cycles but they are spaced out. Things stayed the same even when she changed doses. She has lost weight but states that it goes up and down. She had some concerns about some pain around breast and she had an ultrasound but she still having pain.   ALLERGIES:  is allergic to erythromycin.  MEDICATIONS:  Current Outpatient Medications  Medication Sig Dispense Refill   amLODipine (NORVASC) 5 MG tablet Take 5 mg by mouth daily.     Biotin 5000 MCG CAPS Take by mouth daily.     Calcium-Phosphorus-Vitamin D (CALCIUM GUMMIES PO) Take 1,000 mg by mouth daily.     cetirizine (ZYRTEC) 10 MG tablet Take 10 mg by mouth.     famotidine (PEPCID) 20  MG tablet Take 1 tablet (20 mg total) by mouth at bedtime as needed for heartburn or indigestion. (Patient not taking: Reported on 09/25/2020) 30 tablet 3   hydrochlorothiazide (HYDRODIURIL) 25 MG tablet Take 1 tablet (25 mg total) by mouth daily.     Multiple Vitamin (MULTIVITAMIN) tablet Take 1 tablet by mouth daily. (Patient not taking: Reported on 09/25/2020)     pantoprazole (PROTONIX) 40 MG tablet Take 1 tablet (40 mg total) by mouth daily. Please call [phone] to schedule an office visit for more refills 90 tablet 1   SUMAtriptan (IMITREX) 50 MG tablet sumatriptan 50 mg tablet  TAKE 1 TABLET (50 MG DOSE) BY MOUTH ONCE AS NEEDED FOR MIGRAINE FOR UP TO 1 DOSE.     tamoxifen (NOLVADEX) 20 MG tablet Take 1 tablet (20 mg total) by mouth daily. 90 tablet 3   No current facility-administered medications for this visit.    PHYSICAL EXAMINATION: ECOG PERFORMANCE STATUS: 1 - Symptomatic but completely ambulatory  Vitals:   11/16/21 0838  BP: (!) 120/94  Pulse: 78  Resp: 18  Temp: (!) 97.2 F (36.2 C)  SpO2: 99%   Filed Weights   11/16/21 0838  Weight: 213 lb 14.4 oz (97 kg)      LABORATORY DATA:  I have reviewed the data as listed    Latest Ref Rng & Units 09/04/2021    3:10 PM 03/11/2021    7:33 PM 10/05/2020    8:03 PM  CMP  Glucose 70 - 99 mg/dL 104  134  104   BUN 6 - 20 mg/dL 19  27  16    Creatinine 0.44 - 1.00 mg/dL 0.79  1.23  0.75   Sodium 135 - 145 mmol/L 139  136  137   Potassium 3.5 - 5.1 mmol/L 3.6  2.6  3.2   Chloride 98 - 111 mmol/L 104  99  104   CO2 22 - 32 mmol/L 25  26  20    Calcium 8.9 - 10.3 mg/dL 9.3  9.0  8.8   Total Protein 6.5 - 8.1 g/dL 7.4   7.3   Total Bilirubin 0.3 - 1.2 mg/dL 0.3   0.4   Alkaline Phos 38 - 126 U/L 67   53   AST 15 - 41 U/L 18   22   ALT 0 - 44 U/L 16   17     Lab Results  Component Value Date   WBC 6.0 11/16/2021   HGB 13.0 11/16/2021   HCT 39.8 11/16/2021   MCV 82.1 11/16/2021   PLT 254 11/16/2021   NEUTROABS 3.2  11/16/2021    ASSESSMENT & PLAN:  Malignant neoplasm of upper-inner quadrant of left breast in female, estrogen receptor positive ([place]) 01/26/17: Left lumpectomy: IDC grade 1 and DCIS, 9 mm size, margins negative, 0/2 lymph nodes negative, ER 95%, PR 100 %, HER-2 negative, Ki-67 10%T1b N0 stage IA   Treatment summary: 1. Adjuvant radiation therapy 03/08/2017-04/20/2017 2. Adjuvant antiestrogen therapy with tamoxifen 20 mg daily 10 years versus switching her after she becomes menopausal to anastrozole.  Started January 2019 (reducing the dosage to 10 mg daily on 04/27/2021 because of her uterine polyps)   Tamoxifen toxicities:   Night sweats: Continues to be a problem but she is managing it. Uterine hypertrophy/polyps: [facility] August 2022: reduced the dosage of tamoxifen to 10 mg daily  Patient is contemplating on doing a hysterectomy because of her heavy cycles.  She is planning to undergo additional ultrasound testing with her gynecologist soon.  She will discuss about his getting a hysterectomy.  We will obtain a breast cancer index test to determine if she would benefit from extended endocrine therapy.  I will call her with the result.    Breast cancer surveillance: 1.  Breast exam 04/27/2021: Benign 2. mammograms 10/23/2021 at [facility]: Benign Intermittent breast tenderness: Related to scar tissue.   Iron deficiency anemia due to chronic blood loss Most likely related to heavy menstrual bleeding. (D and C Aug)    IV Iron: 3 doses of IV Venofer 11/04/20, May 2023  Her menstrual cycles have become quite intermittent.  After the May iron infusion she has not had any cycles.   Lab Review: 04/24/21: Iron Sat 8%, TIBC 388, Ferritin: 7 (was 117), Hb 11.2, MCV 83.7 11/09/2021: Hemoglobin 12.6, hematocrit 30.5, MCV 81.1, RDW 25.4 FSH 26.6 (not quite in the menopausal range) 11/16/2021: Hemoglobin 13, iron studies pending  Return to clinic in 3 months with labs and telephone visit 2 days later to  discuss results.    No orders of the defined types were placed in this encounter.  The patient has a good understanding of the overall plan. she agrees with it. she will call with any problems that may develop before the next visit here. Total time spent: 30 mins including face to face time and time spent for planning, charting and co-ordination of care   Harriette Ohara, MD 11/16/21    I Gardiner Coins am scribing for Dr. Lindi Adie  I have  reviewed the above documentation for accuracy and completeness, and I agree with the above.

## 2021-11-09 IMAGING — US US ABDOMEN COMPLETE
1 series · 14 of 25 positions shown · non-contrast
Comparison: None.

CLINICAL DATA: Reflux family history colon cancer

EXAM:
ABDOMEN ULTRASOUND COMPLETE

[Series 1: us abdomen complete · 67 acquisitions, 14 frames shown]
[im 1/67]
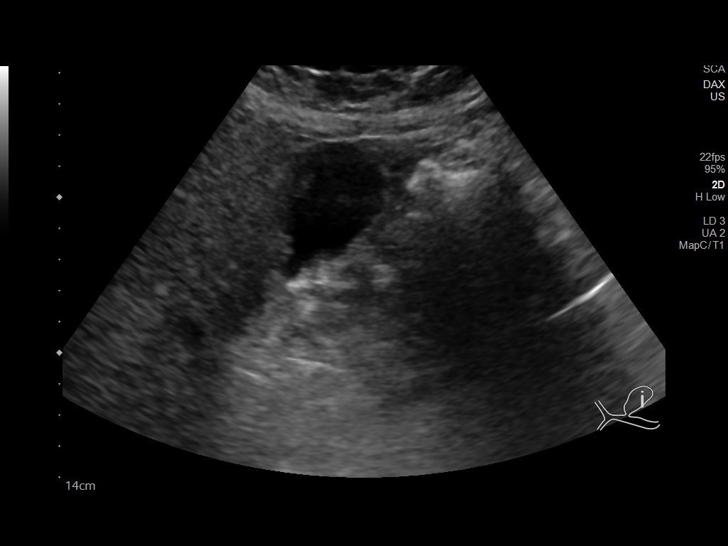
[im 6/67]
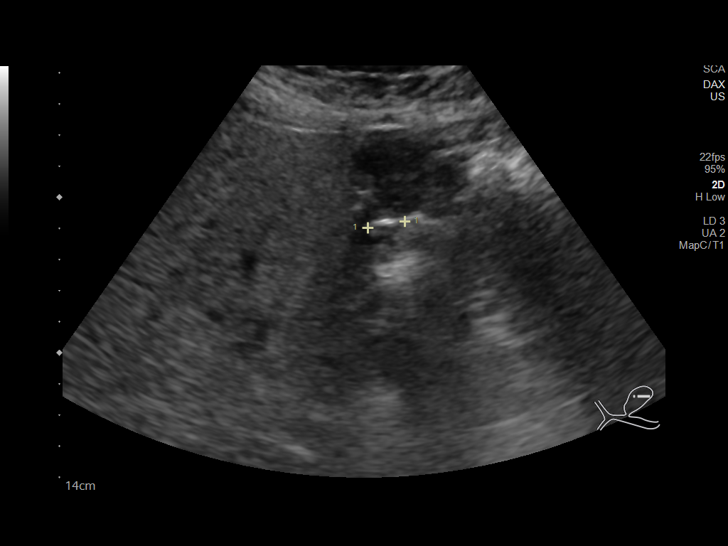
[im 12/67]
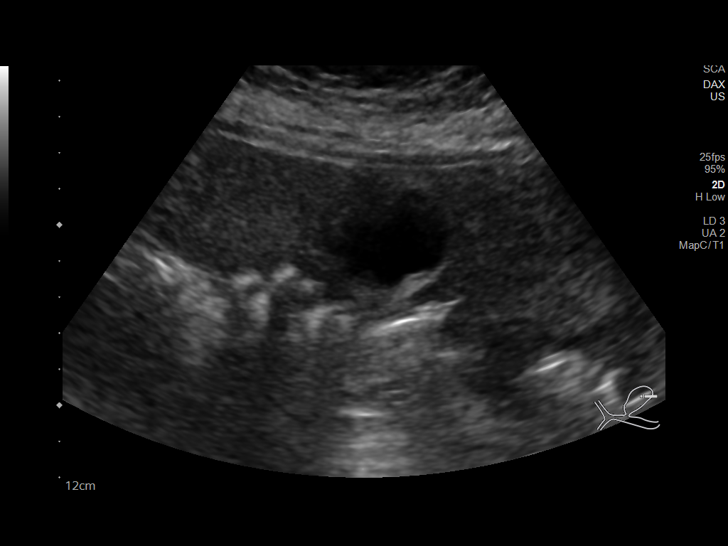
[im 17/67]
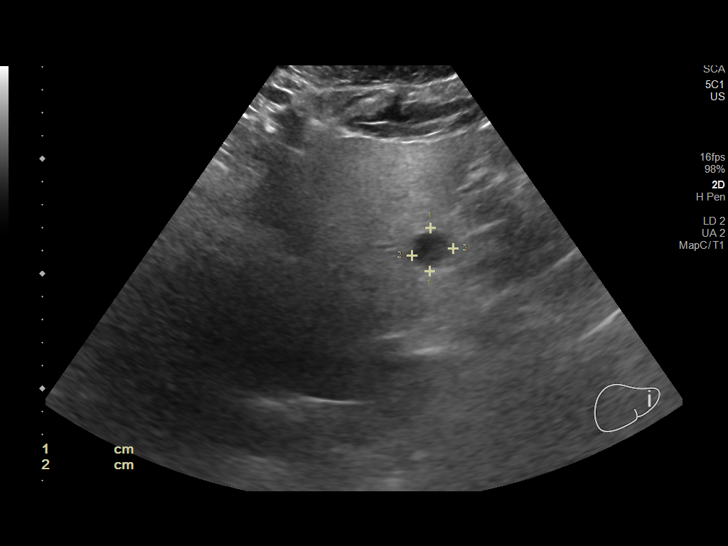
[im 23/67]
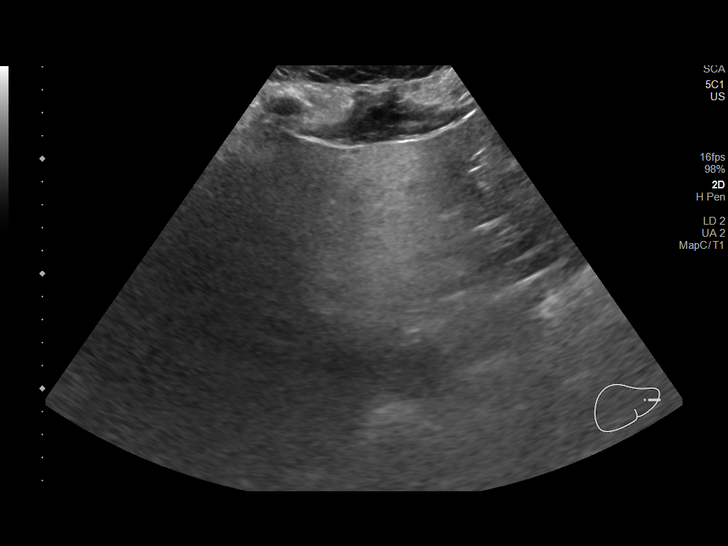
[im 25/67]
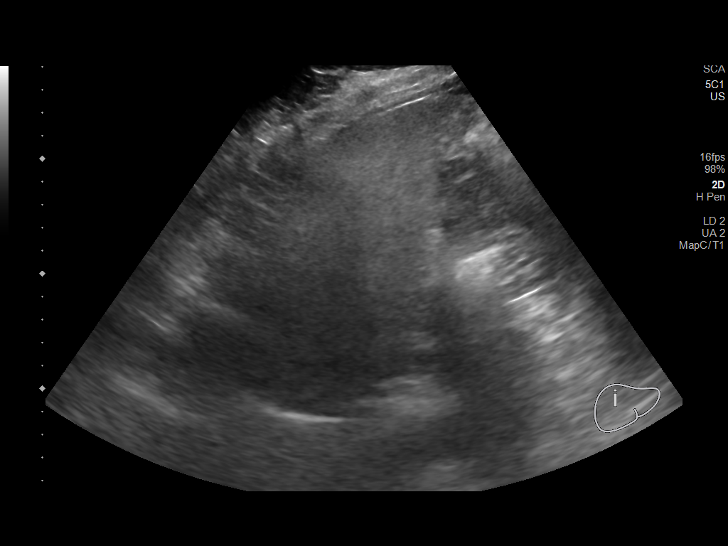
[im 31/67]
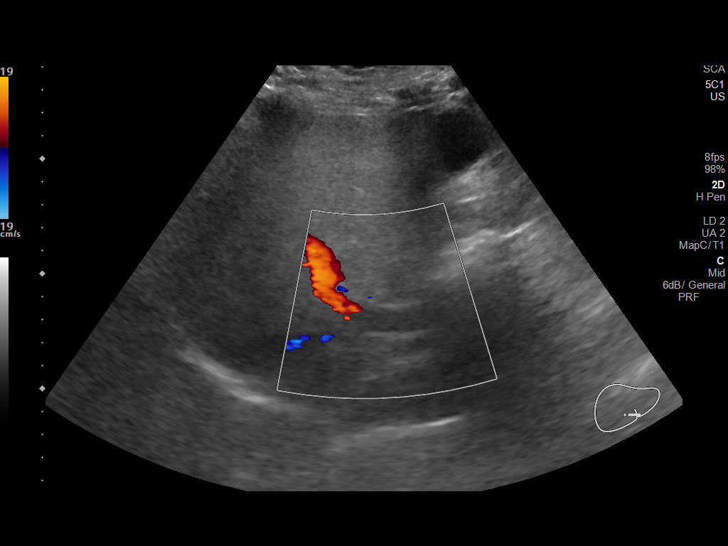
[im 36/67]
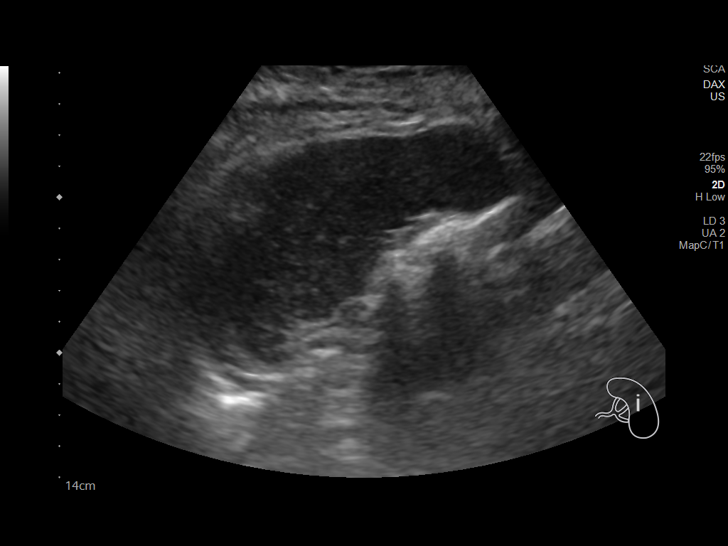
[im 42/67]
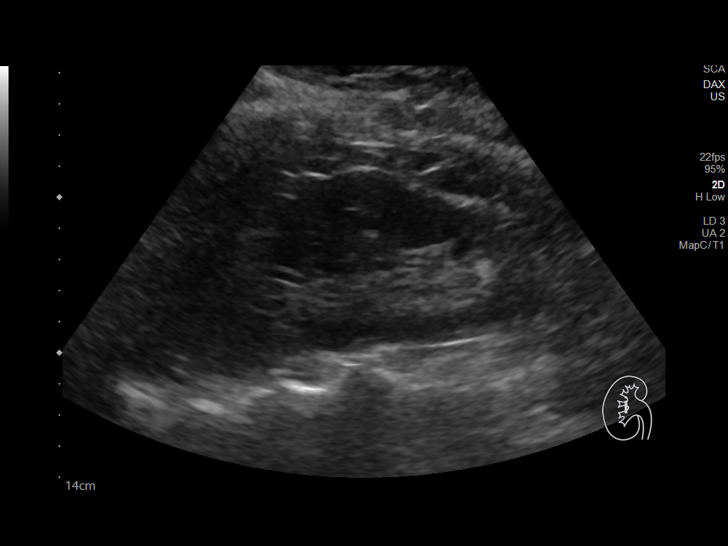
[im 45/67]
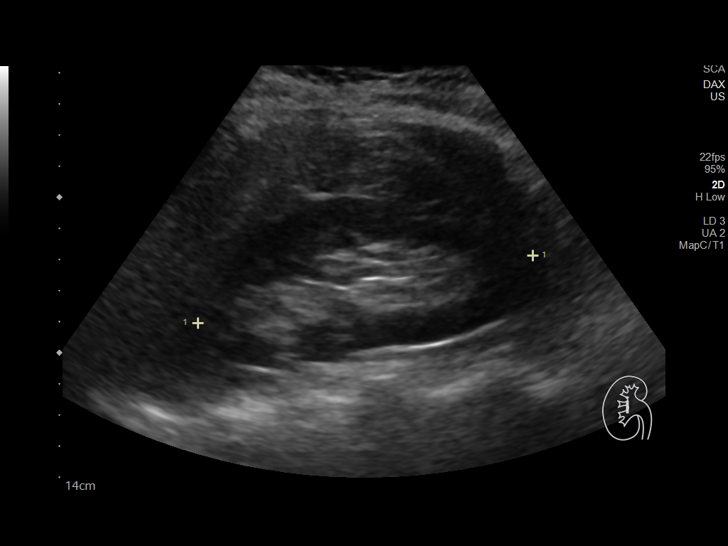
[im 50/67]
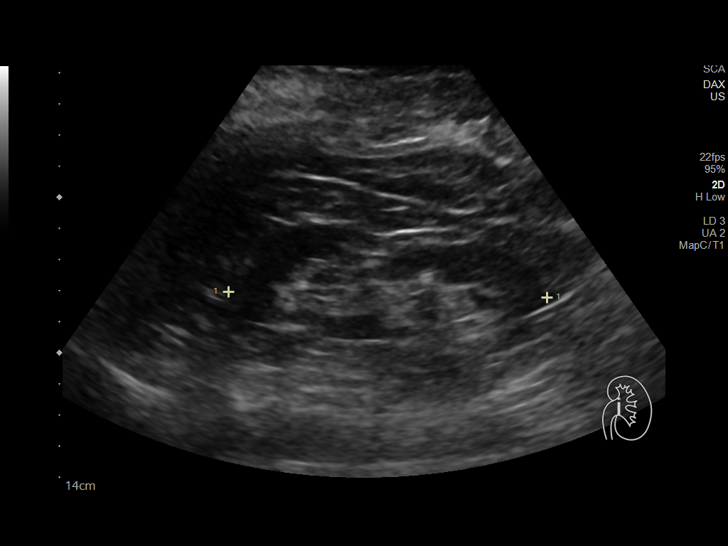
[im 56/67]
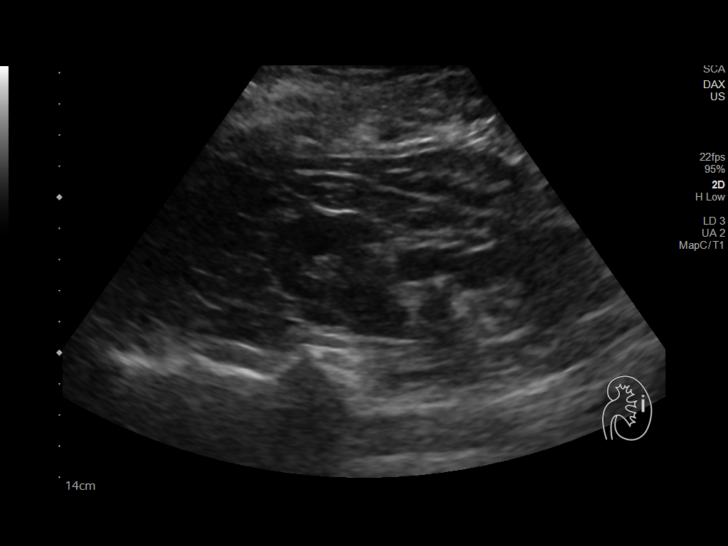
[im 61/67]
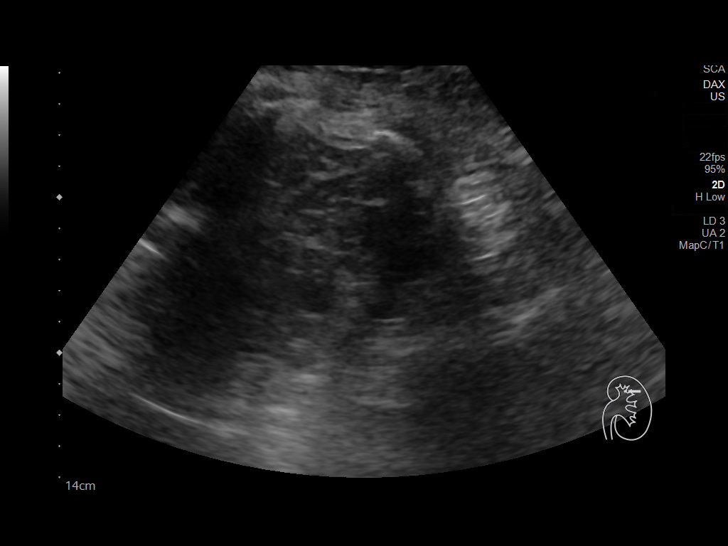
[im 67/67]
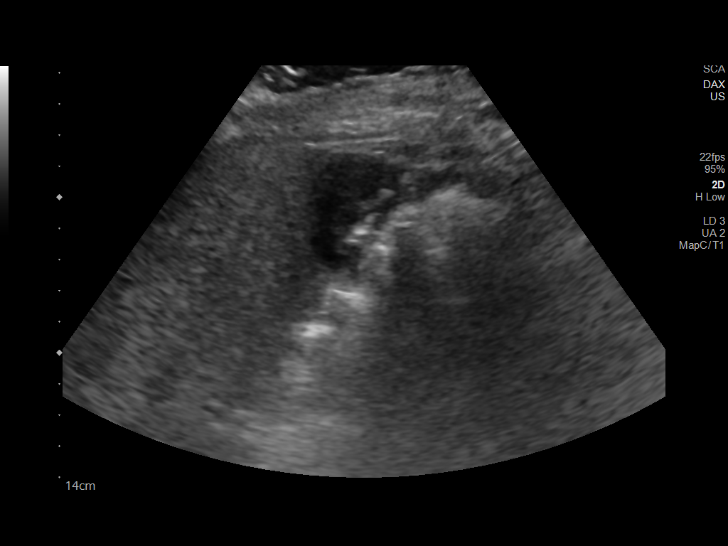

[14 of 25 positions shown; findings below may reference images not displayed]

FINDINGS: Gallbladder: Small sludge and stones within the gallbladder. Normal
wall thickness. Negative sonographic Murphy.

Common bile duct: Diameter: 2.1 mm

Liver: Diffusely echogenic. Small cyst in the left hepatic lobe
measuring 19 mm. Portal vein is patent on color Doppler imaging with
normal direction of blood flow towards the liver.

IVC: No abnormality visualized.

Pancreas: Visualized portion unremarkable.

Spleen: Size and appearance within normal limits.

Right Kidney: Length: 11 cm. Echogenicity within normal limits. No
mass or hydronephrosis visualized.

Left Kidney: Length: 10.2 cm. Echogenicity within normal limits. No
mass or hydronephrosis. Probable scarring at the mid pole of the
left kidney.

Abdominal aorta: No aneurysm visualized.

Other findings: None.
IMPRESSION: 1. Small gallstones and sludge. Negative for acute gallbladder
disease
2. Echogenic liver consistent with hepatic steatosis and or
hepatocellular disease
3. Probable scarring in the left kidney

## 2021-11-16 ENCOUNTER — Other Ambulatory Visit: Payer: Self-pay | Admitting: *Deleted

## 2021-11-16 ENCOUNTER — Other Ambulatory Visit: Payer: Self-pay

## 2021-11-16 ENCOUNTER — Encounter: Payer: Self-pay | Admitting: *Deleted

## 2021-11-16 ENCOUNTER — Inpatient Hospital Stay: Payer: No Typology Code available for payment source | Attending: Hematology and Oncology

## 2021-11-16 ENCOUNTER — Inpatient Hospital Stay (HOSPITAL_BASED_OUTPATIENT_CLINIC_OR_DEPARTMENT_OTHER): Payer: No Typology Code available for payment source | Admitting: Hematology and Oncology

## 2021-11-16 DIAGNOSIS — C50212 Malignant neoplasm of upper-inner quadrant of left female breast: Secondary | ICD-10-CM

## 2021-11-16 DIAGNOSIS — D500 Iron deficiency anemia secondary to blood loss (chronic): Secondary | ICD-10-CM

## 2021-11-16 DIAGNOSIS — Z79810 Long term (current) use of selective estrogen receptor modulators (SERMs): Secondary | ICD-10-CM | POA: Insufficient documentation

## 2021-11-16 DIAGNOSIS — Z170 Estrogen receptor positive status [ER+]: Secondary | ICD-10-CM

## 2021-11-16 DIAGNOSIS — Z79899 Other long term (current) drug therapy: Secondary | ICD-10-CM | POA: Diagnosis not present

## 2021-11-16 LAB — CBC WITH DIFFERENTIAL (CANCER CENTER ONLY)
Abs Immature Granulocytes: 0.01 10*3/uL (ref 0.00–0.07)
Basophils Absolute: 0 10*3/uL (ref 0.0–0.1)
Basophils Relative: 0 %
Eosinophils Absolute: 0.1 10*3/uL (ref 0.0–0.5)
Eosinophils Relative: 1 %
HCT: 39.8 % (ref 36.0–46.0)
Hemoglobin: 13 g/dL (ref 12.0–15.0)
Immature Granulocytes: 0 %
Lymphocytes Relative: 37 %
Lymphs Abs: 2.2 10*3/uL (ref 0.7–4.0)
MCH: 26.8 pg (ref 26.0–34.0)
MCHC: 32.7 g/dL (ref 30.0–36.0)
MCV: 82.1 fL (ref 80.0–100.0)
Monocytes Absolute: 0.4 10*3/uL (ref 0.1–1.0)
Monocytes Relative: 7 %
Neutro Abs: 3.2 10*3/uL (ref 1.7–7.7)
Neutrophils Relative %: 55 %
Platelet Count: 254 10*3/uL (ref 150–400)
RBC: 4.85 MIL/uL (ref 3.87–5.11)
RDW: 22 % — ABNORMAL HIGH (ref 11.5–15.5)
WBC Count: 6 10*3/uL (ref 4.0–10.5)
nRBC: 0 % (ref 0.0–0.2)

## 2021-11-16 LAB — IRON AND IRON BINDING CAPACITY (CC-WL,HP ONLY)
Iron: 73 ug/dL (ref 28–170)
Saturation Ratios: 19 % (ref 10.4–31.8)
TIBC: 378 ug/dL (ref 250–450)
UIBC: 305 ug/dL (ref 148–442)

## 2021-11-16 LAB — FERRITIN: Ferritin: 44 ng/mL (ref 11–307)

## 2021-11-16 NOTE — Assessment & Plan Note (Addendum)
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 Adjuvant radiation therapy10/30/2018-04/20/2017 2. Adjuvant antiestrogen therapy with tamoxifen 20 mg daily 10 years versus switching her after she becomes menopausal to anastrozole.Started January 2019 (reducing the dosage to 10 mg daily on 04/27/2021 because of her uterine polyps)  Tamoxifen toxicities: 1. Night sweats:Continues to be a problem but she is managing it. 2. Uterine hypertrophy/polyps: [facility] August 2022: reduced the dosage of tamoxifen to 10 mg daily  Patient is contemplating on doing a hysterectomy because of her heavy cycles.  She is planning to undergo additional ultrasound testing with her gynecologist soon.  She will discuss about his getting a hysterectomy.  We will obtain a breast cancer index test to determine if she would benefit from extended endocrine therapy.  I will call her with the result.  Breast cancer surveillance: 1.Breast exam12/19/2022: Benign 2.mammograms6/16/2023at [facility]: Benign

## 2021-11-16 NOTE — Progress Notes (Signed)
Per MD request RN successfully faxed [facility] testing request [phone]).

## 2021-11-16 NOTE — Assessment & Plan Note (Addendum)
Most likely related to heavy menstrual bleeding. (D and C Aug)  IV Iron: 3 doses of IV Venofer 11/04/20, May 2023  Her menstrual cycles have become quite intermittent.  After the May iron infusion she has not had any cycles.  Lab Review: 04/24/21: Iron Sat 8%, TIBC 388, Ferritin: 7 (was 117), Hb 11.2, MCV 83.7 11/09/2021: Hemoglobin 12.6, hematocrit 30.5, MCV 81.1, RDW 25.4 FSH 26.6 (not quite in the menopausal range) 11/16/2021: Hemoglobin 13, iron studies pending  Return to clinic in 3 months with labs and telephone visit 2 days later to discuss results.

## 2021-12-10 ENCOUNTER — Encounter: Payer: Self-pay | Admitting: Hematology and Oncology

## 2021-12-28 ENCOUNTER — Inpatient Hospital Stay: Payer: No Typology Code available for payment source

## 2021-12-28 ENCOUNTER — Other Ambulatory Visit: Payer: Self-pay

## 2021-12-28 ENCOUNTER — Inpatient Hospital Stay
Payer: No Typology Code available for payment source | Attending: Hematology and Oncology | Admitting: Licensed Clinical Social Worker

## 2021-12-28 ENCOUNTER — Encounter: Payer: Self-pay | Admitting: Licensed Clinical Social Worker

## 2021-12-28 ENCOUNTER — Other Ambulatory Visit: Payer: Self-pay | Admitting: Licensed Clinical Social Worker

## 2021-12-28 DIAGNOSIS — Z803 Family history of malignant neoplasm of breast: Secondary | ICD-10-CM

## 2021-12-28 DIAGNOSIS — C50212 Malignant neoplasm of upper-inner quadrant of left female breast: Secondary | ICD-10-CM

## 2021-12-28 DIAGNOSIS — Z800 Family history of malignant neoplasm of digestive organs: Secondary | ICD-10-CM | POA: Diagnosis not present

## 2021-12-28 DIAGNOSIS — Z170 Estrogen receptor positive status [ER+]: Secondary | ICD-10-CM

## 2021-12-28 DIAGNOSIS — Z8051 Family history of malignant neoplasm of kidney: Secondary | ICD-10-CM

## 2021-12-28 DIAGNOSIS — Z8042 Family history of malignant neoplasm of prostate: Secondary | ICD-10-CM

## 2021-12-28 DIAGNOSIS — Z801 Family history of malignant neoplasm of trachea, bronchus and lung: Secondary | ICD-10-CM | POA: Diagnosis not present

## 2021-12-28 LAB — GENETIC SCREENING ORDER

## 2021-12-28 NOTE — Progress Notes (Signed)
REFERRING PROVIDER: Nicholas Lose, MD [place],  [place] [id number]  PRIMARY PROVIDER:  Sue Lush, PA-C  PRIMARY REASON FOR VISIT:  1. Malignant neoplasm of upper-inner quadrant of left breast in female, estrogen receptor positive ([place])   2. Family history of breast cancer   3. Family history of colon cancer   4. Family history of lung cancer   5. Family history of kidney cancer   6. Family history of prostate cancer      HISTORY OF PRESENT ILLNESS:   Ms. Andre, a 56 y.o. female, was seen for a [facility] cancer genetics consultation at the request of Dr. Lindi Adie due to a personal and family history of cancer.  Ms. Mink presents to clinic today to discuss the possibility of a hereditary predisposition to cancer, genetic testing, and to further clarify her future cancer risks, as well as potential cancer risks for family members.   In 2023, at the age of 63, Ms. 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 The treatment plan included lumpectomy, adjuvant radiation and adjuvant antiestrogen therapy.   CANCER HISTORY:  Oncology History  Malignant neoplasm of upper-inner quadrant of left breast in female, estrogen receptor positive ([place])  01/25/2017 Surgery   Left lumpectomy: IDC grade 1 and DCIS, 9 mmsize, margins negative, 0/2 lymph nodes negative, ER 95%, PR 100 %, HER-2 negative, Ki-67 10%T1b N0 stage IA   03/08/2017 - 04/20/2017 Radiation Therapy   Adjuvant radiation   05/2017 -  Anti-estrogen oral therapy   Tamoxifen daily     RISK FACTORS:  Ovaries intact: yes.  Hysterectomy: no.  Menopausal status: premenopausal.  HRT use: 0 years. Colonoscopy: yes;  few polyps . Mammogram within the last year: yes. Up to date with pelvic exams: yes; hx of dysplasia  Past Medical History:  Diagnosis Date   Breast cancer, left ([place]) 02/27/2017   Depression    GERD (gastroesophageal reflux disease)    History of radiation therapy  03/08/17-04/23/17   left breast 50.4 Gy in 28 fractions, boost 10 Gy in 5 fractions   Hot flashes    Hypertension    Obesity     Past Surgical History:  Procedure Laterality Date   ABCESS DRAINAGE Left    left breast when patient was 21   BREAST LUMPECTOMY Left 01/25/2017   2 seperate spots   COLONOSCOPY  2020   COLONOSCOPY  09/25/2020   ESOPHAGOGASTRODUODENOSCOPY     2001 in [place]  09/25/2020    FAMILY HISTORY:  We obtained a detailed, 4-generation family history.  Significant diagnoses are listed below: Family History  Problem Relation Age of Onset   Kidney cancer Mother        dx 13s   Colon cancer Sister 39   Breast cancer Sister 52   Diabetes Brother    Lupus Maternal Aunt    Kidney cancer Maternal Uncle    Prostate cancer Maternal Uncle 42   Colon cancer Paternal Aunt        d. 18s   Cancer Paternal Aunt        unk type; d. 65s   Cancer Paternal Uncle        unk type; d. 49s   Lung cancer Maternal Grandfather    Colon polyps Son    Esophageal cancer Neg Hx    Rectal cancer Neg Hx    Stomach cancer Neg Hx    Ms. Werst has 3  sons. One son has had approximately 5 polyps and is 54 years old. Patient has at least 1 full brother, possibly a full sister but she may be a maternal half sister. This sister was recently diagnosed with breast cancer, triple positive, at 44. No known genetic testing. Patient also has 7 other maternal half siblings, and 7 other paternal half siblings. One of her paternal half sisters had colon cancer at 57 and died at 15. No known genetic testing.   Ms. Perkin mother had kidney cancer in her 67s that was treated with nephrectomy. Patient had 6 maternal aunts/uncles. One uncle has had kidney and prostate cancer in his 59s. Maternal grandfather had lung cancer at passed at 51, history of smoking.   Ms. Macadam father passed at 76. She has limited information about his side of the family. Paternal aunt  died of colorectal cancer in her 50s. An aunt and an uncle also died of cancers in their 53s, unknown types. No other known cancers on this side of the family.  Ms. Perl is unaware of previous family history of genetic testing for hereditary cancer risks. There is no reported Ashkenazi Jewish ancestry. There is no known consanguinity.  GENETIC COUNSELING ASSESSMENT: Ms. Gengler is a 56 y.o. female with a personal and family history of breast cancer, and family history of colon cancer under 81,  which is somewhat suggestive of a hereditary cancer syndrome and predisposition to cancer. We, therefore, discussed and recommended the following at today's visit.   DISCUSSION: We discussed that approximately 10% of breast cancer is hereditary. Most cases of hereditary breast cancer are associated with BRCA1/BRCA2 genes, although there are other genes associated with hereditary cancer as well, including ones associated with colorectal and kidney cancer. Cancers and risks are gene specific. We discussed that testing is beneficial for several reasons including knowing about cancer risks, identifying potential screening and risk-reduction options that may be appropriate, and to understand if other family members could be at risk for cancer and allow them to undergo genetic testing.   We reviewed the characteristics, features and inheritance patterns of hereditary cancer syndromes. We also discussed genetic testing, including the appropriate family members to test, the process of testing, insurance coverage and turn-around-time for results. We discussed the implications of a negative, positive and/or variant of uncertain significant result. We recommended Ms. Ravenscroft pursue genetic testing for the [facility] CancerNext-Expanded+RNA gene panel.   Based on Ms. Wirz's personal and family history of cancer, she meets medical criteria for genetic testing. Despite that she meets criteria, she may still have an out of pocket  cost. We discussed that if her out of pocket cost for testing is over $100, the laboratory will call and confirm whether she wants to proceed with testing.  If the out of pocket cost of testing is less than $100 she will be billed by the genetic testing laboratory.   PLAN: After considering the risks, benefits, and limitations, Ms. Scarola provided informed consent to pursue genetic testing and the blood sample was sent to [facility] for analysis of the CancerNext-Expanded+RNA panel. Results should be available within approximately 2-3 weeks' time, at which point they will be disclosed by telephone to Ms. Brackens, as will any additional recommendations warranted by these results. Ms. Roads will receive a summary of her genetic counseling visit and a copy of her results once available. This information will also be available in Epic.   Ms. Chrisley questions were answered to her satisfaction today. Our contact  information was provided should additional questions or concerns arise. Thank you for the referral and allowing [place] to share in the care of your patient.   Faith Rogue, MS, [facility] Genetic Counselor [email or web address] Phone: [phone]  The patient was seen for a total of 35 minutes in face-to-face genetic counseling. [employer] intern Blenda Nicely was present and assisted with this case.  Dr. Grayland Ormond was available for discussion regarding this case.   _______________________________________________________________________ For Office Staff:  Number of people involved in session: 2 Was an Intern/ student involved with case: yes

## 2022-01-12 ENCOUNTER — Telehealth: Payer: Self-pay | Admitting: Licensed Clinical Social Worker

## 2022-01-12 ENCOUNTER — Ambulatory Visit: Payer: Self-pay | Admitting: Licensed Clinical Social Worker

## 2022-01-12 DIAGNOSIS — Z1379 Encounter for other screening for genetic and chromosomal anomalies: Secondary | ICD-10-CM | POA: Insufficient documentation

## 2022-01-12 NOTE — Telephone Encounter (Signed)
I contacted [name] to discuss her genetic testing results. No pathogenic variants were identified in the 77 genes analyzed. Detailed clinic note to follow.   The test report has been scanned into EPIC and is located under the Molecular Pathology section of the Results Review tab.  A portion of the result report is included below for reference.      Tasha Rogue, MS, [facility] Genetic Counselor [email or web address] Phone: [phone]

## 2022-01-12 NOTE — Progress Notes (Signed)
HPI:   Tasha Thomas was previously seen in the [place] clinic due to a personal and family history of cancer and concerns regarding a hereditary predisposition to cancer. Please refer to our prior cancer genetics clinic note for more information regarding our discussion, assessment and recommendations, at the time. Tasha Thomas recent genetic test results were disclosed to her, as were recommendations warranted by these results. These results and recommendations are discussed in more detail below.  CANCER HISTORY:  Oncology History  Malignant neoplasm of upper-inner quadrant of left breast in female, estrogen receptor positive ([place])  01/25/2017 Surgery   Left lumpectomy: IDC grade 1 and DCIS, 9 mmsize, margins negative, 0/2 lymph nodes negative, ER 95%, PR 100 %, HER-2 negative, Ki-67 10%T1b N0 stage IA   03/08/2017 - 04/20/2017 Radiation Therapy   Adjuvant radiation   05/2017 -  Anti-estrogen oral therapy   Tamoxifen daily     FAMILY HISTORY:  We obtained a detailed, 4-generation family history.  Significant diagnoses are listed below: Family History  Problem Relation Age of Onset   Kidney cancer Mother        dx 51s   Colon cancer Sister 33   Breast cancer Sister 36   Diabetes Brother    Lupus Maternal Aunt    Kidney cancer Maternal Uncle    Prostate cancer Maternal Uncle 21   Colon cancer Paternal Aunt        d. 67s   Cancer Paternal Aunt        unk type; d. 20s   Cancer Paternal Uncle        unk type; d. 62s   Lung cancer Maternal Grandfather    Colon polyps Son    Esophageal cancer Neg Hx    Rectal cancer Neg Hx    Stomach cancer Neg Hx     Tasha Thomas has 3 sons. One son has had approximately 5 polyps and is 54 years old. Patient has at least 1 full brother, possibly a full sister but she may be a maternal half sister. This sister was recently diagnosed with breast cancer, triple positive, at 83. No known genetic testing. Patient also has 7 other  maternal half siblings, and 7 other paternal half siblings. One of her paternal half sisters had colon cancer at 69 and died at 92. No known genetic testing.    Tasha Thomas mother had kidney cancer in her 72s that was treated with nephrectomy. Patient had 6 maternal aunts/uncles. One uncle has had kidney and prostate cancer in his 52s. Maternal grandfather had lung cancer at passed at 62, history of smoking.    Tasha Thomas father passed at 23. She has limited information about his side of the family. Paternal aunt died of colorectal cancer in her 58s. An aunt and an uncle also died of cancers in their 41s, unknown types. No other known cancers on this side of the family.   Tasha Thomas is unaware of previous family history of genetic testing for hereditary cancer risks. There is no reported Ashkenazi Jewish ancestry. There is no known consanguinity.    GENETIC TEST RESULTS:  The [facility] CancerNext-Expanded+RNA Panel found no pathogenic mutations.   The CancerNext-Expanded + RNAinsight gene panel offered by [employer] and includes sequencing and rearrangement analysis for the following 77 genes: IP, ALK, APC*, ATM*, AXIN2, BAP1, BARD1, BLM, BMPR1A, BRCA1*, BRCA2*, BRIP1*, CDC73, CDH1*,CDK4, CDKN1B, CDKN2A, CHEK2*, CTNNA1, DICER1, FANCC, FH, FLCN, GALNT12, KIF1B, LZTR1, MAX, MEN1, MET, MLH1*, MSH2*, MSH3,  MSH6*, MUTYH*, NBN, NF1*, NF2, NTHL1, PALB2*, PHOX2B, PMS2*, POT1, PRKAR1A, PTCH1, PTEN*, RAD51C*, RAD51D*,RB1, RECQL, RET, SDHA, SDHAF2, SDHB, SDHC, SDHD, SMAD4, SMARCA4, SMARCB1, SMARCE1, STK11, SUFU, TMEM127, TP53*,TSC1, TSC2, VHL and XRCC2 (sequencing and deletion/duplication); EGFR, EGLN1, HOXB13, KIT, MITF, PDGFRA, POLD1 and POLE (sequencing only); EPCAM and GREM1 (deletion/duplication only).   The test report has been scanned into EPIC and is located under the Molecular Pathology section of the Results Review tab.  A portion of the result report is included below for reference. Genetic  testing reported out on 01/07/2022.     Even though a pathogenic variant was not identified, possible explanations for the cancer in the family may include: There may be no hereditary risk for cancer in the family. The cancers in Tasha Thomas and/or her family may be sporadic/familial or due to other genetic and environmental factors. There may be a gene mutation in one of these genes that current testing methods cannot detect but that chance is small. There could be another gene that has not yet been discovered, or that we have not yet tested, that is responsible for the cancer diagnoses in the family.   Therefore, it is important to remain in touch with cancer genetics in the future so that we can continue to offer Tasha Thomas the most up to date genetic testing.    ADDITIONAL GENETIC TESTING:  We discussed with Tasha Thomas that her genetic testing was fairly extensive.  If there are additional relevant genes identified to increase cancer risk that can be analyzed in the future, we would be happy to discuss and coordinate this testing at that time.    CANCER SCREENING RECOMMENDATIONS:  Tasha Thomas test result is considered negative (normal).  This means that we have not identified a hereditary cause for her personal and family history of cancer at this time.   An individual's cancer risk and medical management are not determined by genetic test results alone. Overall cancer risk assessment incorporates additional factors, including personal medical history, family history, and any available genetic information that may result in a personalized plan for cancer prevention and surveillance. Therefore, it is recommended she continue to follow the cancer management and screening guidelines provided by her oncology and primary healthcare provider.   RECOMMENDATIONS FOR FAMILY MEMBERS:   Since she did not inherit a identifiable mutation in a cancer predisposition gene included on this panel, her  children could not have inherited a known mutation from her in one of these genes. Individuals in this family might be at some increased risk of developing cancer, over the general population risk, due to the family history of cancer.  Individuals in the family should notify their providers of the family history of cancer. We recommend women in this family have a yearly mammogram beginning at age 16, or 16 years younger than the earliest onset of cancer, an annual clinical breast exam, and perform monthly breast self-exams.  Family members should have colonoscopies by at age 81, or earlier, as recommended by their providers. Other members of the family may still carry a pathogenic variant in one of these genes that Tasha Thomas did not inherit. Based on the family history, we recommend her sister, who was diagnosed with breast cancer at age 98, have genetic counseling and testing. Her other siblings/paternal relatives could consider testing as well given her sister who passed of colon cancer. Tasha Thomas will let us know if we can be of any assistance in coordinating genetic counseling and/or testing  for this family member.     FOLLOW-UP:  Lastly, we discussed with Tasha Thomas that cancer genetics is a rapidly advancing field and it is possible that new genetic tests will be appropriate for her and/or her family members in the future. We encouraged her to remain in contact with cancer genetics on an annual basis so we can update her personal and family histories and let her know of advances in cancer genetics that may benefit this family.   Our contact number was provided. Tasha Thomas questions were answered to her satisfaction, and she knows she is welcome to call us at anytime with additional questions or concerns.    Faith Rogue, MS, [facility] Genetic Counselor [email or web address] Phone: [phone]

## 2022-02-14 NOTE — Progress Notes (Signed)
HEMATOLOGY-ONCOLOGY TELEPHONE VISIT PROGRESS NOTE  I connected with our patient on 02/18/22 at  8:30 AM EDT by telephone and verified that I am speaking with the correct person using two identifiers.  I discussed the limitations, risks, security and privacy concerns of performing an evaluation and management service by telephone and the availability of in person appointments.  I also discussed with the patient that there may be a patient responsible charge related to this service. The patient expressed understanding and agreed to proceed.   History of Present Illness: Follow-up on tamoxifen therapy.  Also iron deficiency anemia because of heavy menstrual cycles.  Continues to be a problem in spite of D&C and spite of reducing the dosage of tamoxifen.  Oncology History  Malignant neoplasm of upper-inner quadrant of left breast in female, estrogen receptor positive ([place])  01/25/2017 Surgery   Left lumpectomy: IDC grade 1 and DCIS, 9 mmsize, margins negative, 0/2 lymph nodes negative, ER 95%, PR 100 %, HER-2 negative, Ki-67 10%T1b N0 stage IA   03/08/2017 - 04/20/2017 Radiation Therapy   Adjuvant radiation   05/2017 -  Anti-estrogen oral therapy   Tamoxifen daily     REVIEW OF SYSTEMS:   Constitutional: Denies fevers, chills or abnormal weight loss All other systems were reviewed with the patient and are negative. Observations/Objective:     Assessment Plan:  Malignant neoplasm of upper-inner quadrant of left breast in female, estrogen receptor positive ([place]) 01/26/17: Left lumpectomy: IDC grade 1 and DCIS, 9 mm size, margins negative, 0/2 lymph nodes negative, ER 95%, PR 100 %, HER-2 negative, Ki-67 10%T1b N0 stage IA   Treatment summary: 1. Adjuvant radiation therapy 03/08/2017-04/20/2017 2. Adjuvant antiestrogen therapy with tamoxifen 20 mg daily 10 years versus switching her after she becomes menopausal to anastrozole.  Started January 2019 (reducing the dosage to 10 mg daily on  04/27/2021 because of her uterine polyps)   Tamoxifen toxicities:   Night sweats: Continues to be a problem but she is managing it. Uterine hypertrophy/polyps: [facility] August 2022: reduced the dosage of tamoxifen to 10 mg daily.  In spite of this she continues to have heavy cycles. Breast cancer index: Predicted for benefit from extended endocrine therapy.  Distant recurrence in the next 5 years 4.5% Duration of therapy: 10 years    Breast cancer surveillance: 1.  Breast exam 04/27/2021: Benign 2. mammograms 10/23/2021 [facility]: Benign Intermittent breast tenderness: Related to scar tissue.   Iron deficiency anemia due to chronic blood loss Most likely related to heavy menstrual bleeding. (D and C Aug)    IV Iron: 3 doses of IV Venofer 11/04/20, May 2023   Her menstrual cycles have become quite intermittent.  After the May iron infusion she has not had any cycles.   Lab Review: 04/24/21: Iron Sat 8%, TIBC 388, Ferritin: 7 (was 117), Hb 11.2, MCV 83.7 11/09/2021: Hemoglobin 12.6, hematocrit 30.5, MCV 81.1, RDW 25.4 FSH 26.6 (not quite in the menopausal range) 11/16/2021: Hemoglobin 13 02/16/2022: Hemoglobin 12.4, iron saturation 7%, ferritin 7, TIBC 470  Low iron studies with decent hemoglobin.  I recommended that she get IV iron treatment again.  She will be discussing with her gynecologist about hysterectomy. She has appointments in December for lab and follow-up with me.    I discussed the assessment and treatment plan with the patient. The patient was provided an opportunity to ask questions and all were answered. The patient agreed with the plan and demonstrated an understanding of the instructions. The patient was advised  to call back or seek an in-person evaluation if the symptoms worsen or if the condition fails to improve as anticipated.   I provided 12 minutes of non-face-to-face time during this encounter.  This includes time for charting and coordination of care   Harriette Ohara, MD  I Gardiner Coins am scribing for Dr. Lindi Adie  I have reviewed the above documentation for accuracy and completeness, and I agree with the above.

## 2022-02-15 ENCOUNTER — Other Ambulatory Visit: Payer: Self-pay | Admitting: *Deleted

## 2022-02-15 DIAGNOSIS — C50212 Malignant neoplasm of upper-inner quadrant of left female breast: Secondary | ICD-10-CM

## 2022-02-16 ENCOUNTER — Inpatient Hospital Stay: Payer: No Typology Code available for payment source | Attending: Hematology and Oncology

## 2022-02-16 DIAGNOSIS — D500 Iron deficiency anemia secondary to blood loss (chronic): Secondary | ICD-10-CM | POA: Insufficient documentation

## 2022-02-16 DIAGNOSIS — N920 Excessive and frequent menstruation with regular cycle: Secondary | ICD-10-CM | POA: Insufficient documentation

## 2022-02-16 DIAGNOSIS — Z79810 Long term (current) use of selective estrogen receptor modulators (SERMs): Secondary | ICD-10-CM | POA: Diagnosis not present

## 2022-02-16 DIAGNOSIS — Z170 Estrogen receptor positive status [ER+]: Secondary | ICD-10-CM | POA: Diagnosis not present

## 2022-02-16 DIAGNOSIS — C50212 Malignant neoplasm of upper-inner quadrant of left female breast: Secondary | ICD-10-CM | POA: Insufficient documentation

## 2022-02-16 LAB — IRON AND IRON BINDING CAPACITY (CC-WL,HP ONLY)
Iron: 33 ug/dL (ref 28–170)
Saturation Ratios: 7 % — ABNORMAL LOW (ref 10.4–31.8)
TIBC: 470 ug/dL — ABNORMAL HIGH (ref 250–450)
UIBC: 437 ug/dL (ref 148–442)

## 2022-02-16 LAB — CBC WITH DIFFERENTIAL (CANCER CENTER ONLY)
Abs Immature Granulocytes: 0.01 10*3/uL (ref 0.00–0.07)
Basophils Absolute: 0 10*3/uL (ref 0.0–0.1)
Basophils Relative: 0 %
Eosinophils Absolute: 0.1 10*3/uL (ref 0.0–0.5)
Eosinophils Relative: 2 %
HCT: 37.9 % (ref 36.0–46.0)
Hemoglobin: 12.4 g/dL (ref 12.0–15.0)
Immature Granulocytes: 0 %
Lymphocytes Relative: 36 %
Lymphs Abs: 1.7 10*3/uL (ref 0.7–4.0)
MCH: 27.6 pg (ref 26.0–34.0)
MCHC: 32.7 g/dL (ref 30.0–36.0)
MCV: 84.4 fL (ref 80.0–100.0)
Monocytes Absolute: 0.5 10*3/uL (ref 0.1–1.0)
Monocytes Relative: 10 %
Neutro Abs: 2.5 10*3/uL (ref 1.7–7.7)
Neutrophils Relative %: 52 %
Platelet Count: 295 10*3/uL (ref 150–400)
RBC: 4.49 MIL/uL (ref 3.87–5.11)
RDW: 15.3 % (ref 11.5–15.5)
WBC Count: 4.8 10*3/uL (ref 4.0–10.5)
nRBC: 0 % (ref 0.0–0.2)

## 2022-02-16 LAB — FERRITIN: Ferritin: 7 ng/mL — ABNORMAL LOW (ref 11–307)

## 2022-02-18 ENCOUNTER — Inpatient Hospital Stay (HOSPITAL_BASED_OUTPATIENT_CLINIC_OR_DEPARTMENT_OTHER): Payer: No Typology Code available for payment source | Admitting: Hematology and Oncology

## 2022-02-18 DIAGNOSIS — C50212 Malignant neoplasm of upper-inner quadrant of left female breast: Secondary | ICD-10-CM | POA: Diagnosis not present

## 2022-02-18 DIAGNOSIS — Z170 Estrogen receptor positive status [ER+]: Secondary | ICD-10-CM | POA: Diagnosis not present

## 2022-02-18 DIAGNOSIS — D500 Iron deficiency anemia secondary to blood loss (chronic): Secondary | ICD-10-CM | POA: Diagnosis not present

## 2022-02-18 NOTE — Assessment & Plan Note (Signed)
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 Adjuvant radiation therapy10/30/2018-04/20/2017 2. Adjuvant antiestrogen therapy with tamoxifen 20 mg daily 10 years versus switching her after she becomes menopausal to anastrozole.Started January 2019(reducing the dosage to 10 mg daily on 04/27/2021 because of her uterine polyps)  Tamoxifen toxicities: 1. Night sweats:Continues to be a problem but she is managing it. 2. Uterine hypertrophy/polyps: [facility] August 2022: reduced the dosage of tamoxifen to 10 mg daily Patient is contemplating on doing a hysterectomy because of her heavy cycles. Duration of therapy: 10 years  Breast cancer surveillance: 1.Breast exam12/19/2022: Benign 2.mammograms6/16/2023at [facility]: Benign Intermittent breast tenderness: Related to scar tissue.  Breast cancer index: Predicted for benefit from extended endocrine therapy.  Distant recurrence in the next 5 years 4.5%

## 2022-02-18 NOTE — Assessment & Plan Note (Signed)
Most likely related to heavy menstrual bleeding.(D and C Aug)  IV Iron: 3 doses of IV Venofer6/28/22, May 2023  Her menstrual cycles have become quite intermittent.  After the May iron infusion she has not had any cycles.  Lab Review: 04/24/21: Iron Sat 8%, TIBC 388, Ferritin: 7 (was 117), Hb 11.2, MCV 83.7 11/09/2021: Hemoglobin 12.6, hematocrit 30.5, MCV 81.1, RDW 25.4 FSH 26.6 (not quite in the menopausal range) 11/16/2021: Hemoglobin 13 02/16/2022: Hemoglobin 12.4, iron saturation 7%, ferritin 7, TIBC 470  Low iron studies with decent hemoglobin.  I recommended that she get IV iron treatment again.  Return to clinic in 3 months with labs and telephone visit 2 days later to discuss results.

## 2022-02-18 NOTE — Addendum Note (Signed)
Addended by: Nicholas Lose on: 02/18/2022 08:35 AM   Modules accepted: Orders

## 2022-02-24 ENCOUNTER — Telehealth: Payer: Self-pay | Admitting: Hematology and Oncology

## 2022-02-24 NOTE — Telephone Encounter (Signed)
Scheduled appointment per 10/12 los. Patient is aware. 

## 2022-03-03 ENCOUNTER — Inpatient Hospital Stay: Payer: No Typology Code available for payment source

## 2022-03-03 VITALS — BP 144/98 | HR 72 | Temp 98.2°F | Resp 18

## 2022-03-03 DIAGNOSIS — D500 Iron deficiency anemia secondary to blood loss (chronic): Secondary | ICD-10-CM

## 2022-03-03 MED ORDER — SODIUM CHLORIDE 0.9 % IV SOLN
300.0000 mg | Freq: Once | INTRAVENOUS | Status: AC
  Administered 2022-03-03: 300 mg via INTRAVENOUS
  Filled 2022-03-03: qty 300

## 2022-03-03 NOTE — Progress Notes (Signed)
Patient declined 30 minute post obs vss upon discharge.

## 2022-03-10 ENCOUNTER — Inpatient Hospital Stay: Payer: No Typology Code available for payment source | Attending: Hematology and Oncology

## 2022-03-10 ENCOUNTER — Other Ambulatory Visit: Payer: Self-pay

## 2022-03-10 VITALS — BP 124/87 | HR 93 | Temp 98.2°F | Resp 18 | Ht 64.0 in | Wt 218.0 lb

## 2022-03-10 DIAGNOSIS — D500 Iron deficiency anemia secondary to blood loss (chronic): Secondary | ICD-10-CM | POA: Insufficient documentation

## 2022-03-10 DIAGNOSIS — N920 Excessive and frequent menstruation with regular cycle: Secondary | ICD-10-CM | POA: Diagnosis not present

## 2022-03-10 MED ORDER — SODIUM CHLORIDE 0.9 % IV SOLN
300.0000 mg | Freq: Once | INTRAVENOUS | Status: AC
  Administered 2022-03-10: 300 mg via INTRAVENOUS
  Filled 2022-03-10: qty 300

## 2022-03-10 MED ORDER — SODIUM CHLORIDE 0.9 % IV SOLN: INTRAVENOUS | Status: DC

## 2022-03-10 NOTE — Patient Instructions (Signed)

## 2022-03-10 NOTE — Progress Notes (Signed)
Patient declined to stay post iron 30 minute observation. VSS and patient ambulatory to lobby at discharge.

## 2022-03-17 ENCOUNTER — Inpatient Hospital Stay: Payer: No Typology Code available for payment source

## 2022-03-17 VITALS — BP 128/87 | HR 79 | Temp 98.9°F | Resp 16

## 2022-03-17 DIAGNOSIS — D500 Iron deficiency anemia secondary to blood loss (chronic): Secondary | ICD-10-CM | POA: Diagnosis not present

## 2022-03-17 MED ORDER — SODIUM CHLORIDE 0.9 % IV SOLN: Freq: Once | INTRAVENOUS | Status: AC

## 2022-03-17 MED ORDER — SODIUM CHLORIDE 0.9 % IV SOLN
300.0000 mg | Freq: Once | INTRAVENOUS | Status: AC
  Administered 2022-03-17: 300 mg via INTRAVENOUS
  Filled 2022-03-17: qty 300

## 2022-03-17 NOTE — Progress Notes (Signed)
Pt declined to be observed for 30 minutes post Venofer infusion. Pt tolerated trtmt well w/out incident. VSS at discharge.  Ambulatory to lobby.   

## 2022-03-17 NOTE — Patient Instructions (Signed)

## 2022-04-13 ENCOUNTER — Encounter: Payer: Self-pay | Admitting: Hematology and Oncology

## 2022-04-23 ENCOUNTER — Inpatient Hospital Stay: Payer: No Typology Code available for payment source | Attending: Hematology and Oncology

## 2022-04-23 DIAGNOSIS — Z170 Estrogen receptor positive status [ER+]: Secondary | ICD-10-CM | POA: Diagnosis not present

## 2022-04-23 DIAGNOSIS — Z79810 Long term (current) use of selective estrogen receptor modulators (SERMs): Secondary | ICD-10-CM | POA: Insufficient documentation

## 2022-04-23 DIAGNOSIS — C50212 Malignant neoplasm of upper-inner quadrant of left female breast: Secondary | ICD-10-CM | POA: Diagnosis not present

## 2022-04-23 DIAGNOSIS — D500 Iron deficiency anemia secondary to blood loss (chronic): Secondary | ICD-10-CM | POA: Diagnosis present

## 2022-04-23 DIAGNOSIS — Z79899 Other long term (current) drug therapy: Secondary | ICD-10-CM | POA: Diagnosis not present

## 2022-04-23 LAB — CBC WITH DIFFERENTIAL (CANCER CENTER ONLY)
Abs Immature Granulocytes: 0.01 10*3/uL (ref 0.00–0.07)
Basophils Absolute: 0 10*3/uL (ref 0.0–0.1)
Basophils Relative: 0 %
Eosinophils Absolute: 0.1 10*3/uL (ref 0.0–0.5)
Eosinophils Relative: 1 %
HCT: 40.7 % (ref 36.0–46.0)
Hemoglobin: 13.4 g/dL (ref 12.0–15.0)
Immature Granulocytes: 0 %
Lymphocytes Relative: 41 %
Lymphs Abs: 2.2 10*3/uL (ref 0.7–4.0)
MCH: 28.6 pg (ref 26.0–34.0)
MCHC: 32.9 g/dL (ref 30.0–36.0)
MCV: 87 fL (ref 80.0–100.0)
Monocytes Absolute: 0.4 10*3/uL (ref 0.1–1.0)
Monocytes Relative: 7 %
Neutro Abs: 2.6 10*3/uL (ref 1.7–7.7)
Neutrophils Relative %: 51 %
Platelet Count: 255 10*3/uL (ref 150–400)
RBC: 4.68 MIL/uL (ref 3.87–5.11)
RDW: 16.7 % — ABNORMAL HIGH (ref 11.5–15.5)
WBC Count: 5.3 10*3/uL (ref 4.0–10.5)
nRBC: 0 % (ref 0.0–0.2)

## 2022-04-23 LAB — IRON AND IRON BINDING CAPACITY (CC-WL,HP ONLY)
Iron: 56 ug/dL (ref 28–170)
Saturation Ratios: 16 % (ref 10.4–31.8)
TIBC: 361 ug/dL (ref 250–450)
UIBC: 305 ug/dL (ref 148–442)

## 2022-04-23 LAB — FERRITIN: Ferritin: 101 ng/mL (ref 11–307)

## 2022-04-23 NOTE — Progress Notes (Incomplete)
 Patient Care Team: Payne, Morgan H, PA-C as PCP - General (Physician Assistant) Causey, Lindsey Cornetto, NP as Nurse Practitioner (Hematology and Oncology) Gudena, Vinay, MD as Consulting Physician (Hematology and Oncology) Kinard, James, MD as Consulting Physician (Radiation Oncology) Lininger, Michael D., MD as Referring Physician (Surgery)  DIAGNOSIS: No diagnosis found.  SUMMARY OF ONCOLOGIC HISTORY: Oncology History  Malignant neoplasm of upper-inner quadrant of left breast in female, estrogen receptor positive ([facility])  01/25/2017 Surgery   Left lumpectomy: IDC grade 1 and DCIS, 9 mmsize, margins negative, 0/2 lymph nodes negative, ER 95%, PR 100 %, HER-2 negative, Ki-67 10%T1b N0 stage IA   03/08/2017 - 04/20/2017 Radiation Therapy   Adjuvant radiation   05/2017 -  Anti-estrogen oral therapy   Tamoxifen daily     CHIEF COMPLIANT:   INTERVAL HISTORY: Tasha Thomas is a   ALLERGIES:  is allergic to erythromycin.  MEDICATIONS:  Current Outpatient Medications  Medication Sig Dispense Refill   amLODipine (NORVASC) 5 MG tablet Take 5 mg by mouth daily.     Biotin 5000 MCG CAPS Take by mouth daily.     Calcium-Phosphorus-Vitamin D (CALCIUM GUMMIES PO) Take 1,000 mg by mouth daily.     cetirizine (ZYRTEC) 10 MG tablet Take 10 mg by mouth.     famotidine (PEPCID) 20 MG tablet Take 1 tablet (20 mg total) by mouth at bedtime as needed for heartburn or indigestion. (Patient not taking: Reported on 09/25/2020) 30 tablet 3   hydrochlorothiazide (HYDRODIURIL) 25 MG tablet Take 1 tablet (25 mg total) by mouth daily.     Multiple Vitamin (MULTIVITAMIN) tablet Take 1 tablet by mouth daily. (Patient not taking: Reported on 09/25/2020)     pantoprazole (PROTONIX) 40 MG tablet Take 1 tablet (40 mg total) by mouth daily. Please call [phone] to schedule an office visit for more refills 90 tablet 1   SUMAtriptan (IMITREX) 50 MG tablet sumatriptan 50 mg tablet  TAKE 1 TABLET (50 MG  DOSE) BY MOUTH ONCE AS NEEDED FOR MIGRAINE FOR UP TO 1 DOSE.     tamoxifen (NOLVADEX) 20 MG tablet Take 1 tablet (20 mg total) by mouth daily. 90 tablet 3   No current facility-administered medications for this visit.    PHYSICAL EXAMINATION: ECOG PERFORMANCE STATUS: {CHL ONC ECOG PS:[id number]}  There were no vitals filed for this visit. There were no vitals filed for this visit.  BREAST:*** No palpable masses or nodules in either right or left breasts. No palpable axillary supraclavicular or infraclavicular adenopathy no breast tenderness or nipple discharge. (exam performed in the presence of a chaperone)  LABORATORY DATA:  I have reviewed the data as listed    Latest Ref Rng & Units 09/04/2021    3:10 PM 03/11/2021    7:33 PM 10/05/2020    8:03 PM  CMP  Glucose 70 - 99 mg/dL 104  134  104   BUN 6 - 20 mg/dL 19  27  16   Creatinine 0.44 - 1.00 mg/dL 0.79  1.23  0.75   Sodium 135 - 145 mmol/L 139  136  137   Potassium 3.5 - 5.1 mmol/L 3.6  2.6  3.2   Chloride 98 - 111 mmol/L 104  99  104   CO2 22 - 32 mmol/L 25  26  20   Calcium 8.9 - 10.3 mg/dL 9.3  9.0  8.8   Total Protein 6.5 - 8.1 g/dL 7.4   7.3   Total Bilirubin 0.3 - 1.2 mg/dL 0.3     0.4   Alkaline Phos 38 - 126 U/L 67   53   AST 15 - 41 U/L 18   22   ALT 0 - 44 U/L 16   17     Lab Results  Component Value Date   WBC 5.3 04/23/2022   HGB 13.4 04/23/2022   HCT 40.7 04/23/2022   MCV 87.0 04/23/2022   PLT 255 04/23/2022   NEUTROABS 2.6 04/23/2022    ASSESSMENT & PLAN:  No problem-specific Assessment & Plan notes found for this encounter.    No orders of the defined types were placed in this encounter.  The patient has a good understanding of the overall plan. she agrees with it. she will call with any problems that may develop before the next visit here. Total time spent: 30 mins including face to face time and time spent for planning, charting and co-ordination of care   Deritra L Mcnairy,  CMA 04/23/22    I Deritra, Mcnairy am acting as a scribe for Dr.Vinay Gudena  ***  

## 2022-04-26 ENCOUNTER — Inpatient Hospital Stay (HOSPITAL_BASED_OUTPATIENT_CLINIC_OR_DEPARTMENT_OTHER): Payer: No Typology Code available for payment source | Admitting: Hematology and Oncology

## 2022-04-26 VITALS — BP 131/92 | HR 83 | Temp 97.2°F | Resp 18 | Ht 64.0 in | Wt 216.1 lb

## 2022-04-26 DIAGNOSIS — D500 Iron deficiency anemia secondary to blood loss (chronic): Secondary | ICD-10-CM | POA: Diagnosis not present

## 2022-04-26 DIAGNOSIS — C50212 Malignant neoplasm of upper-inner quadrant of left female breast: Secondary | ICD-10-CM

## 2022-04-26 DIAGNOSIS — Z170 Estrogen receptor positive status [ER+]: Secondary | ICD-10-CM

## 2022-04-26 MED ORDER — ANASTROZOLE 1 MG PO TABS: 1.0000 mg | ORAL_TABLET | Freq: Every day | ORAL | 3 refills | Status: DC

## 2022-04-26 NOTE — Addendum Note (Signed)
Addended by: Nicholas Lose on: 04/26/2022 08:47 AM   Modules accepted: Orders

## 2022-04-26 NOTE — Assessment & Plan Note (Signed)
Most likely related to heavy menstrual bleeding. (D and C Aug)    IV Iron: 3 doses of IV Venofer 11/04/20, May 2023, October 2023   Her menstrual cycles have become quite intermittent.   Lab Review: 04/24/21: Iron Sat 8%, TIBC 388, Ferritin: 7 (was 117), Hb 11.2, MCV 83.7 11/09/2021: Hemoglobin 12.6, hematocrit 30.5, MCV 81.1, RDW 25.4 FSH 26.6 (not quite in the menopausal range) 11/16/2021: Hemoglobin 13 02/16/2022: Hemoglobin 12.4, iron saturation 7%, ferritin 7, TIBC 470 04/23/2022: Hemoglobin 13.4, iron saturation 16%, TIBC 361, ferritin 101  Excellent response to IV iron therapy.  Recheck labs and follow-up in 6 months

## 2022-04-26 NOTE — Assessment & Plan Note (Addendum)
01/26/17: Left lumpectomy: IDC grade 1 and DCIS, 9 mm size, margins negative, 0/2 lymph nodes negative, ER 95%, PR 100 %, HER-2 negative, Ki-67 10%T1b N0 stage IA   Treatment summary: 1. Adjuvant radiation therapy 03/08/2017-04/20/2017 2. Adjuvant antiestrogen therapy with tamoxifen 20 mg daily 10 years versus switching her after she becomes menopausal to anastrozole.  Started January 2019 (reducing the dosage to 10 mg daily on 04/27/2021 because of her uterine polyps)   Tamoxifen toxicities:   Night sweats: Continues to be a problem but she is managing it. Uterine hypertrophy/polyps: [facility] August 2022: Patient is undergoing hysterectomy on 05/07/2022.  She will also have bilateral salpingo-oophorectomy done at the same time.   Breast cancer index: Predicted for benefit from extended endocrine therapy.  Distant recurrence in the next 5 years 4.5%   Recommendation: Once she has her hysterectomy and BSO, recommend switching to anastrozole  Anastrozole counseling: We discussed the risks and benefits of anti-estrogen therapy with aromatase inhibitors. These include but not limited to insomnia, hot flashes, mood changes, vaginal dryness, bone density loss, and weight gain. We strongly believe that the benefits far outweigh the risks. Patient understands these risks and consented to starting treatment. Planned treatment duration is 5 years.     Breast cancer surveillance: 1.  Breast exam 04/26/2022: Benign 2. mammograms 10/23/2021 at [facility]: Benign Intermittent breast tenderness: Related to scar tissue.  Return to clinic in 1 year for follow-up

## 2022-04-28 ENCOUNTER — Telehealth: Payer: Self-pay | Admitting: Hematology and Oncology

## 2022-04-28 NOTE — Telephone Encounter (Signed)
Scheduled appointment per 12/18 los. Unable to leave a voicemail due to the patients mailbox not being set up.

## 2022-06-11 ENCOUNTER — Encounter: Payer: Self-pay | Admitting: Gastroenterology

## 2022-06-11 ENCOUNTER — Ambulatory Visit: Payer: No Typology Code available for payment source | Admitting: Gastroenterology

## 2022-06-11 VITALS — BP 118/74 | HR 101 | Ht 64.0 in | Wt 211.0 lb

## 2022-06-11 DIAGNOSIS — R7989 Other specified abnormal findings of blood chemistry: Secondary | ICD-10-CM | POA: Diagnosis not present

## 2022-06-11 DIAGNOSIS — K219 Gastro-esophageal reflux disease without esophagitis: Secondary | ICD-10-CM

## 2022-06-11 DIAGNOSIS — Z800 Family history of malignant neoplasm of digestive organs: Secondary | ICD-10-CM | POA: Diagnosis not present

## 2022-06-11 DIAGNOSIS — Z86010 Personal history of colonic polyps: Secondary | ICD-10-CM

## 2022-06-11 MED ORDER — PANTOPRAZOLE SODIUM 40 MG PO TBEC: 40.0000 mg | DELAYED_RELEASE_TABLET | Freq: Every day | ORAL | 5 refills | Status: AC

## 2022-06-11 NOTE — Progress Notes (Signed)
Chief Complaint: FU  Referring Provider:  Sue Lush, PA-C      ASSESSMENT AND PLAN;   #1. 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 #2. 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 New information - half sis had colon cancer   Plan: -Protonix '40mg'$  po qd #90, 4 refills. -Recall colon May 2027 -Encouraged her to gradually reduce weight. -FU as needed.    HPI:    Tasha Thomas is a 57 y.o. female   For follow-up visit.  Here for medication refill. S/p lap hysterectomy with BSO Anemia has resolved.  Most recent hemoglobin on 04/23/2022 was 13.4.  So has a cough.  No further craving of ice.  Feels great.  No diarrhea or constipation.  Most recent LFTs were normal.   SH-has situational anxiety, husband had motorbike accident requiring 150 stitches, patient diagnosed with early stage breast cancer (Last year), also had suicide in the family  Wt Readings from Last 3 Encounters:  06/11/22 211 lb (95.7 kg)  04/26/22 216 lb 1.6 oz (98 kg)  03/10/22 218 lb (98.9 kg)     Past GI procedures:  EGD 09/2020 -Mild gastritis -Neg gastric and SB Bx for celiac  Colonoscopy 09/2020: neg except for small internal hemorrhoids.  Otherwise normal to TI.  CT AP with contrast 09/2020 1. No bowel obstruction. Normal appendix. 2. Distended and thickened endometrium. Further evaluation with pelvic ultrasound and/or hysteroscopy on a nonemergent/outpatient basis recommended. 3. Moderate left renal parenchyma atrophy with cortical scarring. Small nonobstructing left renal calculi. No hydronephrosis. 4. Ill-defined rounded structure in the posterior bladder adjacent to the left UVJ may represent a ureterocele. Further evaluation with retrograde cystogram or cystoscopy recommended. 5. Cholelithiasis. 6. Fatty liver  EGD 09/18/2018 -Mildly tortuous esophagus. -Large gastric polyp status post polypectomy. Bx-hyperplastic -Mild  gastritis. Neg HP  Colonoscopy 09/18/2018 - Small colonic polyps status post polypectomy. Bx- TA.  - Small internal hemorrhoids. - Otherwise normal colonoscopy to TI. Past Medical History:  Diagnosis Date   Breast cancer, left ([place]) 02/27/2017   Depression    GERD (gastroesophageal reflux disease)    History of radiation therapy 03/08/17-04/23/17   left breast 50.4 Gy in 28 fractions, boost 10 Gy in 5 fractions   Hot flashes    Hypertension    Obesity     Past Surgical History:  Procedure Laterality Date   ABCESS DRAINAGE Left    left breast when patient was 21   ABDOMINAL HYSTERECTOMY     BREAST LUMPECTOMY Left 01/25/2017   2 seperate spots   COLONOSCOPY  2020   COLONOSCOPY  09/25/2020   ESOPHAGOGASTRODUODENOSCOPY     2001 in [place]  09/25/2020    Family History  Problem Relation Age of Onset   Kidney cancer Mother        dx 62s   Colon cancer Sister 35   Breast cancer Sister 67   Diabetes Brother    Lupus Maternal Aunt    Kidney cancer Maternal Uncle    Prostate cancer Maternal Uncle 33   Colon cancer Paternal Aunt        d. 31s   Cancer Paternal Aunt        unk type; d. 61s   Cancer Paternal Uncle        unk type; d. 43s   Lung cancer Maternal Grandfather    Colon polyps Son    Esophageal cancer Neg  Hx    Rectal cancer Neg Hx    Stomach cancer Neg Hx     Social History   Tobacco Use   Smoking status: Never   Smokeless tobacco: Never  Vaping Use   Vaping Use: Never used  Substance Use Topics   Alcohol use: Yes    Comment: 1x per week, socially   Drug use: No    Current Outpatient Medications  Medication Sig Dispense Refill   amLODipine (NORVASC) 5 MG tablet Take 5 mg by mouth daily.     anastrozole (ARIMIDEX) 1 MG tablet Take 1 tablet (1 mg total) by mouth daily. 90 tablet 3   Calcium-Phosphorus-Vitamin D (CALCIUM GUMMIES PO) Take 1,000 mg by mouth daily.     cetirizine (ZYRTEC) 10 MG tablet Take 10 mg by  mouth.     famotidine (PEPCID) 20 MG tablet Take 1 tablet (20 mg total) by mouth at bedtime as needed for heartburn or indigestion. 30 tablet 3   hydrochlorothiazide (HYDRODIURIL) 25 MG tablet Take 1 tablet (25 mg total) by mouth daily.     Multiple Vitamin (MULTIVITAMIN) tablet Take 1 tablet by mouth daily.     pantoprazole (PROTONIX) 40 MG tablet Take 1 tablet (40 mg total) by mouth daily. Please call [phone] to schedule an office visit for more refills 90 tablet 1   SUMAtriptan (IMITREX) 50 MG tablet sumatriptan 50 mg tablet  TAKE 1 TABLET (50 MG DOSE) BY MOUTH ONCE AS NEEDED FOR MIGRAINE FOR UP TO 1 DOSE.     No current facility-administered medications for this visit.    Allergies  Allergen Reactions   Erythromycin Rash    Review of Systems:  neg    Physical Exam:    BP 118/74   Pulse (!) 101   Ht '5\' 4"'$  (1.626 m)   Wt 211 lb (95.7 kg)   BMI 36.22 kg/m  Filed Weights   06/11/22 1115  Weight: 211 lb (95.7 kg)   Gen: awake, alert, NAD HEENT: anicteric, no pallor CV: RRR, no mrg Pulm: CTA b/l Abd: soft, NT/ND, +BS throughout Ext: no c/c/e Neuro: nonfocal     Latest Ref Rng & Units 04/23/2022    8:22 AM 02/16/2022    8:37 AM 11/16/2021    8:33 AM  CBC  WBC 4.0 - 10.5 K/uL 5.3  4.8  6.0   Hemoglobin 12.0 - 15.0 g/dL 13.4  12.4  13.0   Hematocrit 36.0 - 46.0 % 40.7  37.9  39.8   Platelets 150 - 400 K/uL 255  295  254       Latest Ref Rng & Units 09/04/2021    3:10 PM 03/11/2021    7:33 PM 10/05/2020    8:03 PM  CMP  Glucose 70 - 99 mg/dL 104  134  104   BUN 6 - 20 mg/dL '19  27  16   '$ Creatinine 0.44 - 1.00 mg/dL 0.79  1.23  0.75   Sodium 135 - 145 mmol/L 139  136  137   Potassium 3.5 - 5.1 mmol/L 3.6  2.6  3.2   Chloride 98 - 111 mmol/L 104  99  104   CO2 22 - 32 mmol/L '25  26  20   '$ Calcium 8.9 - 10.3 mg/dL 9.3  9.0  8.8   Total Protein 6.5 - 8.1 g/dL 7.4   7.3   Total Bilirubin 0.3 - 1.2 mg/dL 0.3   0.4   Alkaline Phos 38 - 126 U/L 67  53   AST 15 -  41 U/L 18   22   ALT 0 - 44 U/L 16   17          Carmell Austria, MD 06/11/2022, 11:41 AM  Cc: Sue Lush, PA-C

## 2022-06-11 NOTE — Patient Instructions (Addendum)
_______________________________________________________  If your blood pressure at your visit was 140/90 or greater, please contact your primary care physician to follow up on this.  _______________________________________________________  If you are age 55 or older, your body mass index should be between 23-30. Your Body mass index is 36.22 kg/m. If this is out of the aforementioned range listed, please consider follow up with your Primary Care Provider.  If you are age 1 or younger, your body mass index should be between 19-25. Your Body mass index is 36.22 kg/m. If this is out of the aformentioned range listed, please consider follow up with your Primary Care Provider.   ________________________________________________________  The [facility] providers would like to encourage you to use [facility] to communicate with providers for non-urgent requests or questions.  Due to long hold times on the telephone, sending your provider a message by [facility] may be a faster and more efficient way to get a response.  Please allow 48 business hours for a response.  Please remember that this is for non-urgent requests.  _______________________________________________________  We have sent the following medications to your pharmacy for you to pick up at your convenience: Protonix  Repeat colonoscopy for 09-2025. Please call 2 months prior to schedule this. A letter will be sent as it gets closer.  Thank you,  Dr. Jackquline Denmark

## 2022-10-10 ENCOUNTER — Encounter: Payer: Self-pay | Admitting: Hematology and Oncology

## 2022-11-12 ENCOUNTER — Encounter: Payer: Self-pay | Admitting: Hematology and Oncology

## 2023-02-15 ENCOUNTER — Telehealth: Payer: Self-pay | Admitting: Pharmacy Technician

## 2023-02-15 ENCOUNTER — Other Ambulatory Visit (HOSPITAL_COMMUNITY): Payer: Self-pay

## 2023-02-15 ENCOUNTER — Encounter: Payer: Self-pay | Admitting: Adult Health

## 2023-02-15 NOTE — Telephone Encounter (Signed)
Pharmacy Patient Advocate Encounter   Received notification from CoverMyMeds that prior authorization for PANTOPRAZOLE 40MG  is required/requested.   Insurance verification completed.   The patient is insured through [employer] .   Per test claim: PA required; PA submitted to [employer] via CoverMyMeds Key/confirmation #/EOC [id number] Status is pending

## 2023-02-15 NOTE — Telephone Encounter (Signed)
Pharmacy Patient Advocate Encounter  Received notification from [employer] that Prior Authorization for PANTOPRAZOLE 40MG  has been APPROVED from 10.3.24 to 10.3.27   PA #/Case ID/Reference #: [id number]

## 2023-02-27 ENCOUNTER — Other Ambulatory Visit: Payer: Self-pay | Admitting: Hematology and Oncology

## 2023-02-28 NOTE — Telephone Encounter (Signed)
Per last OV, continue anastrozole. Patient has follow up in December 2024. Lorayne Marek, RN

## 2023-04-27 ENCOUNTER — Ambulatory Visit: Payer: No Typology Code available for payment source | Admitting: Hematology and Oncology

## 2023-04-27 ENCOUNTER — Other Ambulatory Visit: Payer: No Typology Code available for payment source

## 2023-04-28 ENCOUNTER — Other Ambulatory Visit: Payer: Self-pay

## 2023-04-28 DIAGNOSIS — D500 Iron deficiency anemia secondary to blood loss (chronic): Secondary | ICD-10-CM

## 2023-04-28 DIAGNOSIS — C50212 Malignant neoplasm of upper-inner quadrant of left female breast: Secondary | ICD-10-CM

## 2023-04-29 ENCOUNTER — Inpatient Hospital Stay: Payer: No Typology Code available for payment source | Admitting: Hematology and Oncology

## 2023-04-29 ENCOUNTER — Inpatient Hospital Stay: Payer: No Typology Code available for payment source | Attending: Adult Health

## 2023-04-29 ENCOUNTER — Telehealth: Payer: Self-pay

## 2023-04-29 VITALS — BP 114/74 | HR 74 | Temp 98.3°F | Resp 18 | Ht 64.0 in | Wt 180.0 lb

## 2023-04-29 DIAGNOSIS — R61 Generalized hyperhidrosis: Secondary | ICD-10-CM | POA: Diagnosis not present

## 2023-04-29 DIAGNOSIS — C50212 Malignant neoplasm of upper-inner quadrant of left female breast: Secondary | ICD-10-CM | POA: Insufficient documentation

## 2023-04-29 DIAGNOSIS — R634 Abnormal weight loss: Secondary | ICD-10-CM | POA: Insufficient documentation

## 2023-04-29 DIAGNOSIS — D500 Iron deficiency anemia secondary to blood loss (chronic): Secondary | ICD-10-CM | POA: Insufficient documentation

## 2023-04-29 DIAGNOSIS — Z170 Estrogen receptor positive status [ER+]: Secondary | ICD-10-CM | POA: Diagnosis not present

## 2023-04-29 DIAGNOSIS — N920 Excessive and frequent menstruation with regular cycle: Secondary | ICD-10-CM | POA: Diagnosis not present

## 2023-04-29 DIAGNOSIS — Z79811 Long term (current) use of aromatase inhibitors: Secondary | ICD-10-CM | POA: Insufficient documentation

## 2023-04-29 LAB — CBC WITH DIFFERENTIAL (CANCER CENTER ONLY)
Abs Immature Granulocytes: 0.01 10*3/uL (ref 0.00–0.07)
Basophils Absolute: 0 10*3/uL (ref 0.0–0.1)
Basophils Relative: 0 %
Eosinophils Absolute: 0 10*3/uL (ref 0.0–0.5)
Eosinophils Relative: 1 %
HCT: 42.9 % (ref 36.0–46.0)
Hemoglobin: 14.3 g/dL (ref 12.0–15.0)
Immature Granulocytes: 0 %
Lymphocytes Relative: 48 %
Lymphs Abs: 1.8 10*3/uL (ref 0.7–4.0)
MCH: 29.3 pg (ref 26.0–34.0)
MCHC: 33.3 g/dL (ref 30.0–36.0)
MCV: 87.9 fL (ref 80.0–100.0)
Monocytes Absolute: 0.3 10*3/uL (ref 0.1–1.0)
Monocytes Relative: 7 %
Neutro Abs: 1.6 10*3/uL — ABNORMAL LOW (ref 1.7–7.7)
Neutrophils Relative %: 44 %
Platelet Count: 237 10*3/uL (ref 150–400)
RBC: 4.88 MIL/uL (ref 3.87–5.11)
RDW: 13.2 % (ref 11.5–15.5)
WBC Count: 3.7 10*3/uL — ABNORMAL LOW (ref 4.0–10.5)
nRBC: 0 % (ref 0.0–0.2)

## 2023-04-29 LAB — IRON AND IRON BINDING CAPACITY (CC-WL,HP ONLY)
Iron: 92 ug/dL (ref 28–170)
Saturation Ratios: 26 % (ref 10.4–31.8)
TIBC: 356 ug/dL (ref 250–450)
UIBC: 264 ug/dL (ref 148–442)

## 2023-04-29 LAB — FERRITIN: Ferritin: 45 ng/mL (ref 11–307)

## 2023-04-29 NOTE — Assessment & Plan Note (Signed)
01/26/17: Left lumpectomy: IDC grade 1 and DCIS, 9 mm size, margins negative, 0/2 lymph nodes negative, ER 95%, PR 100 %, HER-2 negative, Ki-67 10%T1b N0 stage IA   Treatment summary: 1. Adjuvant radiation therapy 03/08/2017-04/20/2017 2. Adjuvant antiestrogen therapy with tamoxifen 20 mg daily Started January 2019 (reducing the dosage to 10 mg daily on 04/27/2021 because of her uterine polyps), switched to anastrozole 04/26/2022   Tamoxifen toxicities:   Night sweats: Continues to be a problem but she is managing it. Uterine hypertrophy/polyps: [facility] August 2022: Patient is undergoing hysterectomy on 05/07/2022.  She will also have bilateral salpingo-oophorectomy done at the same time.    Breast cancer index: Predicted for benefit from extended endocrine therapy.  Distant recurrence in the next 5 years 4.5%   Anastrozole toxicities:.  Breast cancer surveillance: Breast exam 04/29/2023: Benign Mammogram 11/05/2022: [facility]: Benign, breast density category B

## 2023-04-29 NOTE — Telephone Encounter (Signed)
 Per md orders entered for Guardant Reveal and all supported documents faxed to [phone]. Faxed confirmation was received.

## 2023-04-29 NOTE — Progress Notes (Signed)
Patient Care Team: Leonard Downing as PCP - General (Physician Assistant) Axel Filler Larna Daughters, NP as Nurse Practitioner (Hematology and Oncology) Serena Croissant, MD as Consulting Physician (Hematology and Oncology) Antony Blackbird, MD as Consulting Physician (Radiation Oncology) Alesia Morin., MD as Referring Physician (Surgery)  DIAGNOSIS:  Encounter Diagnoses  Name Primary?   Malignant neoplasm of upper-inner quadrant of left breast in female, estrogen receptor positive (HCC) Yes   Iron deficiency anemia due to chronic blood loss     SUMMARY OF ONCOLOGIC HISTORY: Oncology History  Malignant neoplasm of upper-inner quadrant of left breast in female, estrogen receptor positive (HCC)  01/25/2017 Surgery   Left lumpectomy: IDC grade 1 and DCIS, 9 mmsize, margins negative, 0/2 lymph nodes negative, ER 95%, PR 100 %, HER-2 negative, Ki-67 10%T1b N0 stage IA   03/08/2017 - 04/20/2017 Radiation Therapy   Adjuvant radiation   05/2017 -  Anti-estrogen oral therapy   Tamoxifen daily switched to anastrozole December 2023 after BSO and hysterectomy     CHIEF COMPLIANT: Follow-up on anastrozole therapy  HISTORY OF PRESENT ILLNESS:  History of Present Illness   The patient, with a history of hysterectomy, presents for a follow-up visit. She reports improved energy levels and no further issues with bleeding or iron deficiency since the surgery. However, she has been experiencing significant side effects from anastrozole, a medication she has been on for six years. These side effects include severe hot flashes and stiffness and aches in the fingers and toes, which are particularly noticeable after periods of inactivity. The patient also reports a sensation of wetness in her toes. She has developed trigger finger, a condition known to be associated with anastrozole. The patient has recently lost 40 pounds through a calorie-deficit diet and has had surgery nine days prior to the  visit.         ALLERGIES:  is allergic to erythromycin.  MEDICATIONS:  Current Outpatient Medications  Medication Sig Dispense Refill   amLODipine (NORVASC) 5 MG tablet Take 5 mg by mouth daily.     anastrozole (ARIMIDEX) 1 MG tablet TAKE 1 TABLET DAILY 90 tablet 3   Calcium-Phosphorus-Vitamin D (CALCIUM GUMMIES PO) Take 1,000 mg by mouth daily.     cetirizine (ZYRTEC) 10 MG tablet Take 10 mg by mouth.     famotidine (PEPCID) 20 MG tablet Take 1 tablet (20 mg total) by mouth at bedtime as needed for heartburn or indigestion. 30 tablet 3   hydrochlorothiazide (HYDRODIURIL) 25 MG tablet Take 1 tablet (25 mg total) by mouth daily.     Multiple Vitamin (MULTIVITAMIN) tablet Take 1 tablet by mouth daily.     pantoprazole (PROTONIX) 40 MG tablet Take 1 tablet (40 mg total) by mouth daily. 90 tablet 5   SUMAtriptan (IMITREX) 50 MG tablet sumatriptan 50 mg tablet  TAKE 1 TABLET (50 MG DOSE) BY MOUTH ONCE AS NEEDED FOR MIGRAINE FOR UP TO 1 DOSE.     No current facility-administered medications for this visit.    PHYSICAL EXAMINATION: ECOG PERFORMANCE STATUS: 1 - Symptomatic but completely ambulatory  Vitals:   04/29/23 0822  BP: 114/74  Pulse: 74  Resp: 18  Temp: 98.3 F (36.8 C)  SpO2: 100%   Filed Weights   04/29/23 0822  Weight: 180 lb (81.6 kg)    Physical Exam   CHEST: Presence of scar tissue. SKIN: Scar tissue observed.      (exam performed in the presence of a chaperone)  LABORATORY DATA:  I  have reviewed the data as listed    Latest Ref Rng & Units 09/04/2021    3:10 PM 03/11/2021    7:33 PM 10/05/2020    8:03 PM  CMP  Glucose 70 - 99 mg/dL 161  096  045   BUN 6 - 20 mg/dL 19  27  16    Creatinine 0.44 - 1.00 mg/dL 4.09  8.11  9.14   Sodium 135 - 145 mmol/L 139  136  137   Potassium 3.5 - 5.1 mmol/L 3.6  2.6  3.2   Chloride 98 - 111 mmol/L 104  99  104   CO2 22 - 32 mmol/L 25  26  20    Calcium 8.9 - 10.3 mg/dL 9.3  9.0  8.8   Total Protein 6.5 - 8.1 g/dL 7.4    7.3   Total Bilirubin 0.3 - 1.2 mg/dL 0.3   0.4   Alkaline Phos 38 - 126 U/L 67   53   AST 15 - 41 U/L 18   22   ALT 0 - 44 U/L 16   17     Lab Results  Component Value Date   WBC 3.7 (L) 04/29/2023   HGB 14.3 04/29/2023   HCT 42.9 04/29/2023   MCV 87.9 04/29/2023   PLT 237 04/29/2023   NEUTROABS 1.6 (L) 04/29/2023    ASSESSMENT & PLAN:  Malignant neoplasm of upper-inner quadrant of left breast in female, estrogen receptor positive (HCC) 01/26/17: Left lumpectomy: IDC grade 1 and DCIS, 9 mm size, margins negative, 0/2 lymph nodes negative, ER 95%, PR 100 %, HER-2 negative, Ki-67 10%T1b N0 stage IA   Treatment summary: 1. Adjuvant radiation therapy 03/08/2017-04/20/2017 2. Adjuvant antiestrogen therapy with tamoxifen 20 mg daily Started January 2019 (reducing the dosage to 10 mg daily on 04/27/2021 because of her uterine polyps), switched to anastrozole 04/26/2022   Tamoxifen toxicities:   Night sweats: Continues to be a problem but she is managing it. Uterine hypertrophy/polyps: [facility] August 2022: Patient is undergoing hysterectomy on 05/07/2022.  She will also have bilateral salpingo-oophorectomy done at the same time.    Breast cancer index: Predicted for benefit from extended endocrine therapy.  Distant recurrence in the next 5 years 4.5%   Anastrozole toxicities:.  Breast cancer surveillance: Breast exam 04/29/2023: Benign Mammogram 11/05/2022: [facility]: Benign, breast density category B  Iron deficiency anemia due to chronic blood loss Most likely related to heavy menstrual bleeding. (D and C Aug), status post hysterectomy October 2023   IV Iron: 3 doses of IV Venofer 11/04/20, May 2023, October 2023   Her menstrual cycles have become quite intermittent.   Lab Review: 04/24/21: Iron Sat 8%, TIBC 388, Ferritin: 7 (was 117), Hb 11.2, MCV 83.7 11/09/2021: Hemoglobin 12.6, hematocrit 30.5, MCV 81.1, RDW 25.4 FSH 26.6 (not quite in the menopausal range) 11/16/2021: Hemoglobin  13 02/16/2022: Hemoglobin 12.4, iron saturation 7%, ferritin 7, TIBC 470 04/23/2022: Hemoglobin 13.4, iron saturation 16%, TIBC 361, ferritin 101 04/29/2023: WBC 3.7, ANC 1.6, hemoglobin 14.3 (I discussed with her that the leukopenia is very mild and nothing needs to be done about it) ------------------------------------- Assessment and Plan    Breast Cancer Patient has been on anastrozole for 6 years with side effects including hot flashes, joint stiffness, and trigger finger. Discussed the possibility of continuing for one more year to complete 7 years of therapy, or switching back to tamoxifen for a total of 10 years of therapy. Also discussed the option of reducing the dose of anastrozole. -  Reduce anastrozole to half a tablet daily. -Continue for one more year, then reassess.  General Health Maintenance Discussed a novel blood test, Gardant Reveal, which can detect breast cancer even a year or two before it shows up on scans. Patient agreed to start this test. -Start Gardant Reveal blood test every six months.  Weight Loss Patient has lost significant weight through a calorie deficit regimen. No concerns raised. -Encourage continuation of current weight loss regimen.  Follow-up in one year.          No orders of the defined types were placed in this encounter.  The patient has a good understanding of the overall plan. she agrees with it. she will call with any problems that may develop before the next visit here. Total time spent: 30 mins including face to face time and time spent for planning, charting and co-ordination of care   Tamsen Meek, MD 04/29/23

## 2023-04-29 NOTE — Assessment & Plan Note (Signed)
Most likely related to heavy menstrual bleeding. (D and C Aug), status post hysterectomy October 2023   IV Iron: 3 doses of IV Venofer 11/04/20, May 2023, October 2023   Her menstrual cycles have become quite intermittent.   Lab Review: 04/24/21: Iron Sat 8%, TIBC 388, Ferritin: 7 (was 117), Hb 11.2, MCV 83.7 11/09/2021: Hemoglobin 12.6, hematocrit 30.5, MCV 81.1, RDW 25.4 FSH 26.6 (not quite in the menopausal range) 11/16/2021: Hemoglobin 13 02/16/2022: Hemoglobin 12.4, iron saturation 7%, ferritin 7, TIBC 470 04/23/2022: Hemoglobin 13.4, iron saturation 16%, TIBC 361, ferritin 101

## 2023-05-20 ENCOUNTER — Telehealth: Payer: Self-pay | Admitting: *Deleted

## 2023-05-20 NOTE — Telephone Encounter (Signed)
 Per MD request RN placed call to pt with recent Guardant Reveal results being negative.  Pt educated and verbalized understanding.

## 2023-05-25 ENCOUNTER — Encounter: Payer: Self-pay | Admitting: Hematology and Oncology

## 2023-08-25 ENCOUNTER — Encounter: Payer: Self-pay | Admitting: Hematology and Oncology

## 2023-09-23 ENCOUNTER — Encounter: Payer: Self-pay | Admitting: *Deleted

## 2023-09-23 NOTE — Progress Notes (Signed)
 Guardant Reveal Renewal orders placed per MD request.

## 2023-11-15 ENCOUNTER — Encounter: Payer: Self-pay | Admitting: Hematology and Oncology

## 2023-12-06 ENCOUNTER — Encounter: Payer: Self-pay | Admitting: Hematology and Oncology

## 2024-04-29 ENCOUNTER — Encounter: Payer: Self-pay | Admitting: Hematology and Oncology

## 2024-04-30 ENCOUNTER — Telehealth: Payer: Self-pay | Admitting: Hematology and Oncology

## 2024-04-30 ENCOUNTER — Inpatient Hospital Stay: Payer: No Typology Code available for payment source | Admitting: Hematology and Oncology

## 2024-04-30 NOTE — Assessment & Plan Note (Deleted)
 01/26/17: Left lumpectomy: IDC grade 1 and DCIS, 9 mm size, margins negative, 0/2 lymph nodes negative, ER 95%, PR 100 %, HER-2 negative, Ki-67 10%T1b N0 stage IA   Treatment summary: 1. Adjuvant radiation therapy 03/08/2017-04/20/2017 2. Adjuvant antiestrogen therapy with tamoxifen  20 mg daily Started January 2019 (reducing the dosage to 10 mg daily on 04/27/2021 because of her uterine polyps), switched to anastrozole  04/26/2022   Tamoxifen  toxicities:   Night sweats: Continues to be a problem but she is managing it. Uterine hypertrophy/polyps: [facility] August 2022: Patient is undergoing hysterectomy on 05/07/2022.  She will also have bilateral salpingo-oophorectomy done at the same time.    Breast cancer index: Predicted for benefit from extended endocrine therapy.  Distant recurrence in the next 5 years 4.5%   Anastrozole  toxicities: Tolerating it extremely well   Breast cancer surveillance: Breast exam 04/30/2024: Benign Mammogram 11/14/2023: [facility]: Benign, breast density category B

## 2024-04-30 NOTE — Assessment & Plan Note (Deleted)
 Most likely related to heavy menstrual bleeding. (D and C Aug), status post hysterectomy October 2023   IV Iron : 3 doses of IV Venofer  11/04/20, May 2023, October 2023   Her menstrual cycles have become quite intermittent.   Lab Review: 04/24/21: Iron  Sat 8%, TIBC 388, Ferritin: 7 (was 117), Hb 11.2, MCV 83.7 11/09/2021: Hemoglobin 12.6, hematocrit 30.5, MCV 81.1, RDW 25.4 FSH 26.6 (not quite in the menopausal range) 11/16/2021: Hemoglobin 13 02/16/2022: Hemoglobin 12.4, iron  saturation 7%, ferritin 7, TIBC 470 04/23/2022: Hemoglobin 13.4, iron  saturation 16%, TIBC 361, ferritin 101 04/29/2023: WBC 3.7, ANC 1.6, hemoglobin 14.3 (I discussed with her that the leukopenia is very mild and nothing needs to be done about it)

## 2024-04-30 NOTE — Telephone Encounter (Signed)
[id number]  °

## 2024-05-17 ENCOUNTER — Inpatient Hospital Stay: Attending: Hematology and Oncology | Admitting: Hematology and Oncology

## 2024-05-17 VITALS — BP 138/88 | HR 88 | Temp 98.1°F | Resp 16 | Wt 216.8 lb

## 2024-05-17 DIAGNOSIS — Z90710 Acquired absence of both cervix and uterus: Secondary | ICD-10-CM | POA: Insufficient documentation

## 2024-05-17 DIAGNOSIS — Z170 Estrogen receptor positive status [ER+]: Secondary | ICD-10-CM | POA: Insufficient documentation

## 2024-05-17 DIAGNOSIS — D508 Other iron deficiency anemias: Secondary | ICD-10-CM | POA: Insufficient documentation

## 2024-05-17 DIAGNOSIS — D500 Iron deficiency anemia secondary to blood loss (chronic): Secondary | ICD-10-CM

## 2024-05-17 DIAGNOSIS — C50212 Malignant neoplasm of upper-inner quadrant of left female breast: Secondary | ICD-10-CM | POA: Insufficient documentation

## 2024-05-17 DIAGNOSIS — Z79811 Long term (current) use of aromatase inhibitors: Secondary | ICD-10-CM | POA: Insufficient documentation

## 2024-05-17 DIAGNOSIS — Z79899 Other long term (current) drug therapy: Secondary | ICD-10-CM | POA: Insufficient documentation

## 2024-05-17 NOTE — Assessment & Plan Note (Signed)
 Most likely related to heavy menstrual bleeding. (D and C Aug), status post hysterectomy October 2023   IV Iron : 3 doses of IV Venofer  11/04/20, May 2023, October 2023   Her menstrual cycles have become quite intermittent.   Lab Review: 04/24/21: Iron  Sat 8%, TIBC 388, Ferritin: 7 (was 117), Hb 11.2, MCV 83.7 11/09/2021: Hemoglobin 12.6, hematocrit 30.5, MCV 81.1, RDW 25.4 FSH 26.6 (not quite in the menopausal range) 11/16/2021: Hemoglobin 13 02/16/2022: Hemoglobin 12.4, iron  saturation 7%, ferritin 7, TIBC 470 04/23/2022: Hemoglobin 13.4, iron  saturation 16%, TIBC 361, ferritin 101 04/29/2023: WBC 3.7, ANC 1.6, hemoglobin 14.3

## 2024-05-17 NOTE — Progress Notes (Signed)
 "  Patient Care Team: Emilio Joesph VEAR DEVONNA as PCP - General (Physician Assistant) Crawford Morna Pickle, NP as Nurse Practitioner (Hematology and Oncology) Odean Potts, MD as Consulting Physician (Hematology and Oncology) Shannon Agent, MD as Consulting Physician (Radiation Oncology) Bert Ozell BIRCH., MD as Referring Physician (Surgery)  DIAGNOSIS:  Encounter Diagnoses  Name Primary?   Malignant neoplasm of upper-inner quadrant of left breast in female, estrogen receptor positive (HCC) Yes   Iron  deficiency anemia due to chronic blood loss     SUMMARY OF ONCOLOGIC HISTORY: Oncology History  Malignant neoplasm of upper-inner quadrant of left breast in female, estrogen receptor positive (HCC)  01/25/2017 Surgery   Left lumpectomy: IDC grade 1 and DCIS, 9 mmsize, margins negative, 0/2 lymph nodes negative, ER 95%, PR 100 %, HER-2 negative, Ki-67 10%T1b N0 stage IA   03/08/2017 - 04/20/2017 Radiation Therapy   Adjuvant radiation   05/2017 -  Anti-estrogen oral therapy   Tamoxifen  daily switched to anastrozole  December 2023 after BSO and hysterectomy     CHIEF COMPLIANT: Follow-up on anastrozole  therapy  HISTORY OF PRESENT ILLNESS:  History of Present Illness Tasha Thomas is a 59 year old woman with ER+ left breast cancer, status post lumpectomy, adjuvant radiation, and extended endocrine therapy, presenting for routine oncology follow-up and management of endocrine therapy discontinuation.  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 She started anti-estrogen therapy in January 2019 with tamoxifen  and was switched to anastrozole  after surgical menopause from hysterectomy and bilateral salpingo-oophorectomy three years ago.  Surveillance with mammograms, most recently in July 2024, and circulating tumor DNA testing  (Guardant Reveal) every six months has shown no evidence of recurrence. She has no new breast or gynecologic symptoms and no breast pain beyond baseline.  She has significant, difficult-to-tolerate side effects from anastrozole , which led to dose reduction. She has two unopened bottles of anastrozole  at home and is here to address discontinuation of therapy in this context.  She had a hysterectomy three years ago with cessation of menses and resolution of prior iron  deficiency anemia that was attributed to chronic blood loss. December 2024 labs were normal with no iron  concerns relevant to ongoing oncology management.  She and her oncologist reviewed ongoing surveillance, including continued Guardant Reveal testing and routine mammograms. She also inquired about the Guardant test for her sister.       ALLERGIES:  is allergic to erythromycin.  MEDICATIONS:  Current Outpatient Medications  Medication Sig Dispense Refill   amLODipine (NORVASC) 5 MG tablet Take 5 mg by mouth daily.     Calcium-Phosphorus-Vitamin D (CALCIUM GUMMIES PO) Take 1,000 mg by mouth daily.     cetirizine (ZYRTEC) 10 MG tablet Take 10 mg by mouth.     famotidine  (PEPCID ) 20 MG tablet Take 1 tablet (20 mg total) by mouth at bedtime as needed for heartburn or indigestion. 30 tablet 3   hydrochlorothiazide  (HYDRODIURIL ) 25 MG tablet Take 1 tablet (25 mg total) by mouth daily.     Multiple Vitamin (MULTIVITAMIN) tablet Take 1 tablet by mouth daily.     pantoprazole  (PROTONIX ) 40 MG tablet Take 1 tablet (40 mg total) by mouth daily. 90 tablet 5   SUMAtriptan (IMITREX) 50 MG tablet sumatriptan 50 mg tablet  TAKE 1 TABLET (50 MG DOSE) BY MOUTH ONCE AS NEEDED FOR MIGRAINE FOR UP TO 1 DOSE.  No current facility-administered medications for this visit.    PHYSICAL EXAMINATION: ECOG PERFORMANCE STATUS: 1 - Symptomatic but completely ambulatory  Vitals:   05/17/24 0818  BP: 138/88  Pulse: 88  Resp: 16  Temp: 98.1 F  (36.7 C)  SpO2: 99%   Filed Weights   05/17/24 0818  Weight: 216 lb 12.8 oz (98.3 kg)      LABORATORY DATA:  I have reviewed the data as listed    Latest Ref Rng & Units 09/04/2021    3:10 PM 03/11/2021    7:33 PM 10/05/2020    8:03 PM  CMP  Glucose 70 - 99 mg/dL 895  865  895   BUN 6 - 20 mg/dL 19  27  16    Creatinine 0.44 - 1.00 mg/dL 9.20  8.76  9.24   Sodium 135 - 145 mmol/L 139  136  137   Potassium 3.5 - 5.1 mmol/L 3.6  2.6  3.2   Chloride 98 - 111 mmol/L 104  99  104   CO2 22 - 32 mmol/L 25  26  20    Calcium 8.9 - 10.3 mg/dL 9.3  9.0  8.8   Total Protein 6.5 - 8.1 g/dL 7.4   7.3   Total Bilirubin 0.3 - 1.2 mg/dL 0.3   0.4   Alkaline Phos 38 - 126 U/L 67   53   AST 15 - 41 U/L 18   22   ALT 0 - 44 U/L 16   17     Lab Results  Component Value Date   WBC 3.7 (L) 04/29/2023   HGB 14.3 04/29/2023   HCT 42.9 04/29/2023   MCV 87.9 04/29/2023   PLT 237 04/29/2023   NEUTROABS 1.6 (L) 04/29/2023    ASSESSMENT & PLAN:  Malignant neoplasm of upper-inner quadrant of left breast in female, estrogen receptor positive (HCC) 01/26/17: Left lumpectomy: IDC grade 1 and DCIS, 9 mm size, margins negative, 0/2 lymph nodes negative, ER 95%, PR 100 %, HER-2 negative, Ki-67 10%T1b N0 stage IA   Treatment summary: 1. Adjuvant radiation therapy 03/08/2017-04/20/2017 2. Adjuvant antiestrogen therapy with tamoxifen  20 mg daily Started January 2019 (reducing the dosage to 10 mg daily on 04/27/2021 because of her uterine polyps), switched to anastrozole  04/26/2022   Tamoxifen  toxicities:   Night sweats: Continues to be a problem but she is managing it. Uterine hypertrophy/polyps: [facility] August 2022: Patient is undergoing hysterectomy on 05/07/2022.  She will also have bilateral salpingo-oophorectomy done at the same time.    Breast cancer index: Predicted for benefit from extended endocrine therapy.  Distant recurrence in the next 5 years 4.5%   Anastrozole  toxicities:.  Tolerating it  extremely well without any problems or concerns   Breast cancer surveillance: Breast exam 05/17/2024: Benign Mammogram 11/14/2023: [facility]: Benign, breast density category B Guardant reveal July 2025: 0%  Iron  deficiency anemia due to chronic blood loss No further issues from iron  deficiency since she had hysterectomy.   Return to clinic on an as needed basis We will continue Guardant reveal   No orders of the defined types were placed in this encounter.  The patient has a good understanding of the overall plan. she agrees with it. she will call with any problems that may develop before the next visit here.  I personally spent a total of 30 minutes in the care of the patient today including preparing to see the patient, getting/reviewing separately obtained history, performing a medically appropriate exam/evaluation, counseling and educating, placing  orders, referring and communicating with other health care professionals, documenting clinical information in the EHR, independently interpreting results, communicating results, and coordinating care.   Viinay K Sherrelle Prochazka, MD 05/17/2024    "

## 2024-05-17 NOTE — Assessment & Plan Note (Addendum)
 01/26/17: Left lumpectomy: IDC grade 1 and DCIS, 9 mm size, margins negative, 0/2 lymph nodes negative, ER 95%, PR 100 %, HER-2 negative, Ki-67 10%T1b N0 stage IA   Treatment summary: 1. Adjuvant radiation therapy 03/08/2017-04/20/2017 2. Adjuvant antiestrogen therapy with tamoxifen  20 mg daily Started January 2019 (reducing the dosage to 10 mg daily on 04/27/2021 because of her uterine polyps), switched to anastrozole  04/26/2022   Tamoxifen  toxicities:   Night sweats: Continues to be a problem but she is managing it. Uterine hypertrophy/polyps: [facility] August 2022: Patient is undergoing hysterectomy on 05/07/2022.  She will also have bilateral salpingo-oophorectomy done at the same time.    Breast cancer index: Predicted for benefit from extended endocrine therapy.  Distant recurrence in the next 5 years 4.5%   Anastrozole  toxicities:.  Tolerating it extremely well without any problems or concerns   Breast cancer surveillance: Breast exam 05/17/2024: Benign Mammogram 11/14/2023: [facility]: Benign, breast density category B Guardant reveal July 2025: 0%
